# Patient Record
Sex: Female | Born: 1950 | Race: Black or African American | Hispanic: No | Marital: Married | Smoking: Never smoker
Health system: Southern US, Community
[De-identification: ages and names within clinical notes are randomized; demographics above are authoritative.]

## PROBLEM LIST (undated history)

## (undated) DIAGNOSIS — I10 Essential (primary) hypertension: Secondary | ICD-10-CM

## (undated) DIAGNOSIS — E119 Type 2 diabetes mellitus without complications: Secondary | ICD-10-CM

## (undated) DIAGNOSIS — R7303 Prediabetes: Secondary | ICD-10-CM

## (undated) DIAGNOSIS — L309 Dermatitis, unspecified: Secondary | ICD-10-CM

## (undated) DIAGNOSIS — J309 Allergic rhinitis, unspecified: Secondary | ICD-10-CM

## (undated) HISTORY — DX: Essential (primary) hypertension: I10

## (undated) HISTORY — DX: Allergic rhinitis, unspecified: J30.9

## (undated) HISTORY — PX: TUBAL LIGATION: SHX77

## (undated) HISTORY — DX: Dermatitis, unspecified: L30.9

## (undated) HISTORY — DX: Prediabetes: R73.03

## (undated) HISTORY — PX: KNEE SURGERY: SHX244

## (undated) HISTORY — PX: ABDOMINAL HYSTERECTOMY: SHX81

## (undated) HISTORY — PX: COLONOSCOPY: SHX174

---

## 2001-05-20 ENCOUNTER — Ambulatory Visit (HOSPITAL_COMMUNITY): Admission: RE | Admit: 2001-05-20 | Discharge: 2001-05-20 | Payer: Self-pay | Admitting: Obstetrics and Gynecology

## 2001-05-20 ENCOUNTER — Encounter: Payer: Self-pay | Admitting: Obstetrics and Gynecology

## 2002-05-25 ENCOUNTER — Encounter: Payer: Self-pay | Admitting: Family Medicine

## 2002-05-25 ENCOUNTER — Ambulatory Visit (HOSPITAL_COMMUNITY): Admission: RE | Admit: 2002-05-25 | Discharge: 2002-05-25 | Payer: Self-pay | Admitting: Family Medicine

## 2003-07-20 ENCOUNTER — Encounter: Payer: Self-pay | Admitting: Family Medicine

## 2003-07-20 ENCOUNTER — Ambulatory Visit (HOSPITAL_COMMUNITY): Admission: RE | Admit: 2003-07-20 | Discharge: 2003-07-20 | Payer: Self-pay | Admitting: Family Medicine

## 2004-06-11 ENCOUNTER — Ambulatory Visit (HOSPITAL_COMMUNITY): Admission: RE | Admit: 2004-06-11 | Discharge: 2004-06-11 | Payer: Self-pay | Admitting: Family Medicine

## 2004-12-10 ENCOUNTER — Ambulatory Visit (HOSPITAL_COMMUNITY): Admission: RE | Admit: 2004-12-10 | Discharge: 2004-12-10 | Payer: Self-pay | Admitting: Family Medicine

## 2005-02-07 ENCOUNTER — Ambulatory Visit (HOSPITAL_COMMUNITY): Admission: RE | Admit: 2005-02-07 | Discharge: 2005-02-07 | Payer: Self-pay | Admitting: Internal Medicine

## 2005-02-07 ENCOUNTER — Ambulatory Visit: Payer: Self-pay | Admitting: Internal Medicine

## 2006-02-02 ENCOUNTER — Ambulatory Visit (HOSPITAL_COMMUNITY): Admission: RE | Admit: 2006-02-02 | Discharge: 2006-02-02 | Payer: Self-pay | Admitting: Family Medicine

## 2007-02-12 ENCOUNTER — Ambulatory Visit (HOSPITAL_COMMUNITY): Admission: RE | Admit: 2007-02-12 | Discharge: 2007-02-12 | Payer: Self-pay | Admitting: Family Medicine

## 2008-02-29 ENCOUNTER — Ambulatory Visit (HOSPITAL_COMMUNITY): Admission: RE | Admit: 2008-02-29 | Discharge: 2008-02-29 | Payer: Self-pay | Admitting: Family Medicine

## 2009-03-05 ENCOUNTER — Ambulatory Visit (HOSPITAL_COMMUNITY): Admission: RE | Admit: 2009-03-05 | Discharge: 2009-03-05 | Payer: Self-pay | Admitting: Family Medicine

## 2009-11-11 ENCOUNTER — Emergency Department (HOSPITAL_COMMUNITY): Admission: EM | Admit: 2009-11-11 | Discharge: 2009-11-11 | Payer: Self-pay | Admitting: Emergency Medicine

## 2010-02-26 ENCOUNTER — Emergency Department (HOSPITAL_COMMUNITY): Admission: EM | Admit: 2010-02-26 | Discharge: 2010-02-26 | Payer: Self-pay | Admitting: Emergency Medicine

## 2010-03-19 ENCOUNTER — Ambulatory Visit (HOSPITAL_COMMUNITY): Admission: RE | Admit: 2010-03-19 | Discharge: 2010-03-19 | Payer: Self-pay | Admitting: Family Medicine

## 2010-10-10 ENCOUNTER — Ambulatory Visit (HOSPITAL_COMMUNITY)
Admission: RE | Admit: 2010-10-10 | Discharge: 2010-10-10 | Payer: Self-pay | Source: Home / Self Care | Attending: Family Medicine | Admitting: Family Medicine

## 2010-12-29 LAB — URINALYSIS, ROUTINE W REFLEX MICROSCOPIC
Bilirubin Urine: NEGATIVE
Leukocytes, UA: NEGATIVE
Protein, ur: 100 mg/dL — AB
Specific Gravity, Urine: 1.02 (ref 1.005–1.030)
pH: 7.5 (ref 5.0–8.0)

## 2010-12-29 LAB — DIFFERENTIAL
Eosinophils Absolute: 0 10*3/uL (ref 0.0–0.7)
Eosinophils Relative: 1 % (ref 0–5)
Lymphs Abs: 1.4 10*3/uL (ref 0.7–4.0)
Monocytes Absolute: 0.4 10*3/uL (ref 0.1–1.0)
Monocytes Relative: 5 % (ref 3–12)
Neutro Abs: 6.1 10*3/uL (ref 1.7–7.7)
Neutrophils Relative %: 76 % (ref 43–77)

## 2010-12-29 LAB — COMPREHENSIVE METABOLIC PANEL
AST: 29 U/L (ref 0–37)
Albumin: 4.4 g/dL (ref 3.5–5.2)
BUN: 9 mg/dL (ref 6–23)
CO2: 29 mEq/L (ref 19–32)
Calcium: 9.5 mg/dL (ref 8.4–10.5)
Chloride: 98 mEq/L (ref 96–112)
GFR calc non Af Amer: >60 mL/min (ref >60)
Glucose, Bld: 131 mg/dL — ABNORMAL HIGH (ref 70–99)
Sodium: 138 mEq/L (ref 135–145)
Total Bilirubin: 0.6 mg/dL (ref 0.3–1.2)
Total Protein: 7.7 g/dL (ref 6.0–8.3)

## 2010-12-29 LAB — CBC
HCT: 41 % (ref 36.0–46.0)
Platelets: 177 10*3/uL (ref 150–400)
RDW: 13.4 % (ref 11.5–15.5)

## 2010-12-29 LAB — URINE MICROSCOPIC-ADD ON

## 2010-12-29 LAB — URINE CULTURE: Culture: NO GROWTH

## 2010-12-30 LAB — URINE CULTURE
Colony Count: NO GROWTH
Culture: NO GROWTH

## 2010-12-30 LAB — URINALYSIS, ROUTINE W REFLEX MICROSCOPIC
Nitrite: NEGATIVE
Specific Gravity, Urine: 1.015 (ref 1.005–1.030)

## 2010-12-30 LAB — DIFFERENTIAL
Basophils Absolute: 0 10*3/uL (ref 0.0–0.1)
Lymphocytes Relative: 21 % (ref 12–46)
Lymphs Abs: 1.6 10*3/uL (ref 0.7–4.0)
Monocytes Absolute: 0.4 10*3/uL (ref 0.1–1.0)
Neutro Abs: 5.2 10*3/uL (ref 1.7–7.7)
Neutrophils Relative %: 71 % (ref 43–77)

## 2010-12-30 LAB — BASIC METABOLIC PANEL
BUN: 10 mg/dL (ref 6–23)
Creatinine, Ser: 0.94 mg/dL (ref 0.4–1.2)
GFR calc non Af Amer: >60 mL/min (ref >60)
Glucose, Bld: 156 mg/dL — ABNORMAL HIGH (ref 70–99)
Potassium: 2.6 mEq/L — CL (ref 3.5–5.1)

## 2010-12-30 LAB — CBC
MCHC: 35.9 g/dL (ref 30.0–36.0)
MCV: 84.3 fL (ref 78.0–100.0)
RBC: 4.72 MIL/uL (ref 3.87–5.11)
WBC: 7.4 10*3/uL (ref 4.0–10.5)

## 2010-12-30 LAB — URINE MICROSCOPIC-ADD ON

## 2010-12-30 LAB — POCT CARDIAC MARKERS
CKMB, poc: 1.9 ng/mL (ref 1.0–8.0)
Myoglobin, poc: 146 ng/mL (ref 12–200)

## 2010-12-30 LAB — MAGNESIUM: Magnesium: 1.7 mg/dL (ref 1.5–2.5)

## 2011-02-28 NOTE — Op Note (Signed)
NAME:  Tracy Spencer, Tracy Spencer              ACCOUNT NO.:  [id number]   MEDICAL RECORD NO.:  [id number]          PATIENT TYPE:  AMB   LOCATION:  DAY                           FACILITY:  [facility]   PHYSICIAN:  R. Roetta Sessions, M.D. DATE OF BIRTH:  [birth year]   DATE OF PROCEDURE:  02/07/2005  DATE OF DISCHARGE:                                 OPERATIVE REPORT   PROCEDURE:  Screening colonoscopy.   INDICATIONS FOR PROCEDURE:  The patient is a 60 year old African-American  female referred at the courtesy of Dr. Lorin Picket for colorectal cancer  screening. She has never had a colonoscopy. She has no lower GI tract  symptoms. There is no family history of colorectal neoplasia. Colonoscopy is  now being done as a screening maneuver. This approach has been discussed  with the patient. Potential risks, benefits, and alternatives have been  reviewed and questions answered. She is agreeable. Please see documentation  in the medical record.   PROCEDURE NOTE:  O2 saturation, blood pressure, pulse, and respirations were  monitored throughout the entire procedure. Conscious sedation with Versed 3  mg IV and Demerol 75 mg IV in divided doses.   INSTRUMENT:  Olympus video chip system.   FINDINGS:  Digital exam revealed no abnormalities.   ENDOSCOPIC FINDINGS:  Prep was good.   Rectum:  Examination of the rectal mucosa including retroflexed view of the  anal verge revealed no abnormalities.   Colon:  Colonic mucosa was surveyed from the rectosigmoid junction through  the left, transverse, and right colon to the area of the appendiceal  orifice, ileocecal valve, and cecum. These structures were well seen and  photographed for the record. Olympus videoscope was slowly withdrawn. All  previously mentioned mucosal surfaces were again seen. The patient was noted  to have scattered left side diverticula. The remainder of the colonic mucosa  appeared normal. The patient tolerated the procedure well and was reactive  to endoscopy.   IMPRESSION:  1.  Normal rectum.  2.  Left sided diverticula. The remainder of colonic mucosa appeared normal.   RECOMMENDATIONS:  1.  Diverticulosis literature given to [name].  2.  Would recommend a repeat screening colonoscopy in 10 years.      RMR/MEDQ  D:  02/07/2005  T:  02/07/2005  Job:  [id number]   cc:   Lorin Picket A. Gerda Diss, MD  [address]  [place]  [place] [address]  Fax: [phone]

## 2011-03-18 ENCOUNTER — Other Ambulatory Visit: Payer: Self-pay | Admitting: Family Medicine

## 2011-03-18 DIAGNOSIS — Z139 Encounter for screening, unspecified: Secondary | ICD-10-CM

## 2011-03-27 ENCOUNTER — Ambulatory Visit (HOSPITAL_COMMUNITY): Payer: Self-pay

## 2011-03-27 ENCOUNTER — Ambulatory Visit (HOSPITAL_COMMUNITY)
Admission: RE | Admit: 2011-03-27 | Discharge: 2011-03-27 | Disposition: A | Discharge status: Home / Self Care | Payer: 59 | Source: Ambulatory Visit | Attending: Family Medicine | Admitting: Family Medicine

## 2011-03-27 DIAGNOSIS — Z139 Encounter for screening, unspecified: Secondary | ICD-10-CM

## 2011-03-27 DIAGNOSIS — Z1231 Encounter for screening mammogram for malignant neoplasm of breast: Secondary | ICD-10-CM | POA: Insufficient documentation

## 2011-08-20 ENCOUNTER — Other Ambulatory Visit: Payer: Self-pay | Admitting: Family Medicine

## 2011-08-20 ENCOUNTER — Ambulatory Visit (HOSPITAL_COMMUNITY)
Admission: RE | Admit: 2011-08-20 | Discharge: 2011-08-20 | Disposition: A | Discharge status: Home / Self Care | Payer: BC Managed Care – PPO | Source: Ambulatory Visit | Attending: Family Medicine | Admitting: Family Medicine

## 2011-08-20 DIAGNOSIS — M545 Low back pain, unspecified: Secondary | ICD-10-CM | POA: Insufficient documentation

## 2011-08-20 DIAGNOSIS — M47817 Spondylosis without myelopathy or radiculopathy, lumbosacral region: Secondary | ICD-10-CM | POA: Insufficient documentation

## 2011-08-20 DIAGNOSIS — M5430 Sciatica, unspecified side: Secondary | ICD-10-CM

## 2011-08-20 DIAGNOSIS — M79609 Pain in unspecified limb: Secondary | ICD-10-CM | POA: Insufficient documentation

## 2012-02-19 ENCOUNTER — Other Ambulatory Visit: Payer: Self-pay | Admitting: Family Medicine

## 2012-02-19 DIAGNOSIS — Z139 Encounter for screening, unspecified: Secondary | ICD-10-CM

## 2012-03-29 ENCOUNTER — Ambulatory Visit (HOSPITAL_COMMUNITY)
Admission: RE | Admit: 2012-03-29 | Discharge: 2012-03-29 | Disposition: A | Discharge status: Home / Self Care | Payer: BC Managed Care – PPO | Source: Ambulatory Visit | Attending: Family Medicine | Admitting: Family Medicine

## 2012-03-29 DIAGNOSIS — Z1231 Encounter for screening mammogram for malignant neoplasm of breast: Secondary | ICD-10-CM | POA: Insufficient documentation

## 2012-03-29 DIAGNOSIS — Z139 Encounter for screening, unspecified: Secondary | ICD-10-CM

## 2013-01-11 ENCOUNTER — Encounter (HOSPITAL_COMMUNITY): Payer: Self-pay | Admitting: Dietician

## 2013-01-11 NOTE — Progress Notes (Signed)
[facility] Diabetes Class Completion  Date:January 11, 2013  Time: 1000  Pt attended [facility]'s [facility] Education Class on January 11, 2013.   Patient was educated on the following topics: survival skills (signs and symptoms of hyperglycemia and hypoglycemia, treatment for hypoglycemia, ideal levels for fasting and postprandial blood sugars, goal Hgb A1c level, foot care basics), recommendations for physical activity, carbohydrate metabolism in relation to diabetes, and meal planning (sources of carbohydrate, carbohydrate counting, meal planning strategies, food label reading, and portion control).   Tracy Spencer, RD, LDN   

## 2013-01-28 ENCOUNTER — Encounter: Payer: Self-pay | Admitting: *Deleted

## 2013-02-01 ENCOUNTER — Encounter: Payer: Self-pay | Admitting: Family Medicine

## 2013-02-01 ENCOUNTER — Ambulatory Visit (INDEPENDENT_AMBULATORY_CARE_PROVIDER_SITE_OTHER): Payer: BC Managed Care – PPO | Admitting: Family Medicine

## 2013-02-01 VITALS — BP 168/110 | HR 80 | Wt 197.0 lb

## 2013-02-01 DIAGNOSIS — E785 Hyperlipidemia, unspecified: Secondary | ICD-10-CM

## 2013-02-01 DIAGNOSIS — I10 Essential (primary) hypertension: Secondary | ICD-10-CM

## 2013-02-01 DIAGNOSIS — R739 Hyperglycemia, unspecified: Secondary | ICD-10-CM | POA: Insufficient documentation

## 2013-02-01 DIAGNOSIS — R7309 Other abnormal glucose: Secondary | ICD-10-CM

## 2013-02-01 MED ORDER — LOSARTAN POTASSIUM-HCTZ 100-25 MG PO TABS: 1.0000 | ORAL_TABLET | Freq: Every day | ORAL | Status: DC

## 2013-02-01 NOTE — Patient Instructions (Signed)
Our goal is to see her blood pressure closer to the low 130s over low 80s. I want you to check your blood pressure once or twice a week for the next several weeks and then please write those down and send them to our office or bring them by. At the end of day or start of June I want you to do her fasting blood work. I would like to see you in June.  Please get into the habit of walking on a regular basis for exercise as well as minimizing starchy foods in your diet.  Losartan/ hctz daily for blood pressure

## 2013-02-01 NOTE — Progress Notes (Signed)
  Subjective:    Patient ID: Tracy Spencer, female    DOB: Mar 22, 1951, 62 y.o.   MRN: [id number]  Hypertension This is a chronic problem. The current episode started more than 1 year ago. The problem has been gradually worsening since onset. The problem is uncontrolled. Pertinent negatives include no chest pain, headaches, malaise/fatigue, orthopnea, palpitations, peripheral edema or PND. There are no associated agents to hypertension. Risk factors for coronary artery disease include diabetes mellitus and dyslipidemia. Past treatments include angiotensin blockers. The current treatment provides mild improvement. There are no compliance problems.  There is no history of angina or CVA.      Review of Systems  Constitutional: Negative for malaise/fatigue, activity change, appetite change and fatigue.  HENT: Negative for congestion and rhinorrhea.   Respiratory: Negative for cough and chest tightness.   Cardiovascular: Negative for chest pain, palpitations, orthopnea and PND.  Neurological: Negative for headaches.       Objective:   Physical Exam  Vitals reviewed. Constitutional: She appears well-developed.  HENT:  Head: Normocephalic.  Neck: Normal range of motion. No thyromegaly present.  Cardiovascular: Normal rate, regular rhythm and normal heart sounds.   Pulmonary/Chest: Effort normal and breath sounds normal.  Abdominal: Soft.  Lymphadenopathy:    She has no cervical adenopathy.    Blood pressure was checked twice with her machine and twice with the wall unit  190/108      Assessment & Plan:  Hypertension-subpar control, changed to losartan 100 mg/HCTZ 25 mg one tablet each morning check blood pressure 2-3 times over the next several weeks send [place] those readings. Also followup early June. To do lab work before followup. Regular walking watch salt in diet.

## 2013-02-14 ENCOUNTER — Telehealth: Payer: Self-pay | Admitting: Family Medicine

## 2013-02-14 NOTE — Telephone Encounter (Signed)
Pt would like to know if you can call in something for poison ivy that has now reached her face.  I have advised pt that she may need an appt for this, our schedule today did not work for her schedule.  [facility]

## 2013-02-14 NOTE — Telephone Encounter (Signed)
Discuss the case with the patient please typically if it's just small amounts of various steroid creams can help but when it more extensive or around the face we typically use prednisone. Very important to minimize starches in the diet while on prednisone. I would recommend prednisone 20 mg 3 tablets daily for 3 days then 2 tablets daily for 3 days then 1 tablet daily for 3 days.

## 2013-02-15 MED ORDER — PREDNISONE 20 MG PO TABS: ORAL_TABLET | ORAL | Status: DC

## 2013-02-15 NOTE — Telephone Encounter (Signed)
Discussed with patient. Med sent electronically to [facility].

## 2013-02-28 ENCOUNTER — Ambulatory Visit (INDEPENDENT_AMBULATORY_CARE_PROVIDER_SITE_OTHER): Payer: BC Managed Care – PPO | Admitting: Family Medicine

## 2013-02-28 ENCOUNTER — Encounter: Payer: Self-pay | Admitting: Family Medicine

## 2013-02-28 VITALS — BP 130/82 | Temp 98.5°F | Wt 194.0 lb

## 2013-02-28 DIAGNOSIS — L259 Unspecified contact dermatitis, unspecified cause: Secondary | ICD-10-CM

## 2013-02-28 NOTE — Patient Instructions (Signed)
We will set up appt with Dr.Hall

## 2013-02-28 NOTE — Progress Notes (Signed)
  Subjective:    Patient ID: Tracy Spencer, female    DOB: 1951/06/15, 62 y.o.   MRN: [id number]  Rash This is a new problem. The affected locations include the face, left upper leg, left lower leg, left arm, right upper leg, right lower leg, right hand and abdomen. The rash is characterized by itchiness and swelling. Past treatments include moisturizer. The treatment provided no relief.      Review of Systems  Skin: Positive for rash.       Objective:   Physical Exam  Has a non-distinct rash on the arms neck face also she states same rash on lower abdomen and right leg. She denies being around any type outdoor allergens to her knowledge no new medications.      Assessment & Plan:  Rash- derm referral, I am uncertain what is causing this. She is hard he tried a steroid cream without help. I think it's best for her to go ahead and be seen by dermatology. We will help set her up with an appointment.

## 2013-03-08 ENCOUNTER — Other Ambulatory Visit: Payer: Self-pay | Admitting: Family Medicine

## 2013-03-08 DIAGNOSIS — Z139 Encounter for screening, unspecified: Secondary | ICD-10-CM

## 2013-03-09 ENCOUNTER — Other Ambulatory Visit: Payer: Self-pay | Admitting: *Deleted

## 2013-03-09 ENCOUNTER — Telehealth: Payer: Self-pay | Admitting: *Deleted

## 2013-03-09 LAB — HEPATIC FUNCTION PANEL
ALT: 28 U/L (ref 0–35)
AST: 24 U/L (ref 0–37)
Alkaline Phosphatase: 69 U/L (ref 39–117)
Bilirubin, Direct: 0.1 mg/dL (ref 0.0–0.3)
Total Bilirubin: 0.6 mg/dL (ref 0.3–1.2)

## 2013-03-09 LAB — BASIC METABOLIC PANEL
BUN: 10 mg/dL (ref 6–23)
Creat: 1.01 mg/dL (ref 0.50–1.10)
Potassium: 3.2 mEq/L — ABNORMAL LOW (ref 3.5–5.3)

## 2013-03-09 LAB — LIPID PANEL
Cholesterol: 195 mg/dL (ref 0–200)
HDL: 63 mg/dL (ref >39)
Triglycerides: 97 mg/dL (ref <150)

## 2013-03-09 MED ORDER — AMLODIPINE BESYLATE 2.5 MG PO TABS: 2.5000 mg | ORAL_TABLET | Freq: Every day | ORAL | Status: DC

## 2013-03-09 NOTE — Telephone Encounter (Signed)
Message left to pt of medication called into pharmacy Norvasc 2.5mg , and to f/u in June

## 2013-03-10 ENCOUNTER — Telehealth: Payer: Self-pay

## 2013-03-10 NOTE — Telephone Encounter (Signed)
Pt thought it was time for her next colonoscopy. Her last one was 01/2005 by RMR. And he recommended her next for 10 years. She is not having any GI problems and no new family hx of colon cancer. We have her nic'd in the computer to remind for colonoscopy in 01/2015. She will call before then if she has any GI problems.

## 2013-03-21 ENCOUNTER — Ambulatory Visit (INDEPENDENT_AMBULATORY_CARE_PROVIDER_SITE_OTHER): Payer: BC Managed Care – PPO | Admitting: Family Medicine

## 2013-03-21 ENCOUNTER — Encounter: Payer: Self-pay | Admitting: Family Medicine

## 2013-03-21 VITALS — BP 139/88 | HR 80 | Wt 197.0 lb

## 2013-03-21 DIAGNOSIS — E785 Hyperlipidemia, unspecified: Secondary | ICD-10-CM | POA: Insufficient documentation

## 2013-03-21 DIAGNOSIS — R739 Hyperglycemia, unspecified: Secondary | ICD-10-CM

## 2013-03-21 DIAGNOSIS — I10 Essential (primary) hypertension: Secondary | ICD-10-CM

## 2013-03-21 DIAGNOSIS — R7309 Other abnormal glucose: Secondary | ICD-10-CM

## 2013-03-21 MED ORDER — AMLODIPINE BESYLATE 5 MG PO TABS: ORAL_TABLET | ORAL | Status: DC

## 2013-03-21 MED ORDER — ROSUVASTATIN CALCIUM 40 MG PO TABS: 40.0000 mg | ORAL_TABLET | Freq: Every day | ORAL | Status: DC

## 2013-03-21 NOTE — Progress Notes (Signed)
  Subjective:    Patient ID: Tracy Spencer, female    DOB: 22-Aug-1951, 62 y.o.   MRN: [id number]  HPI Patient comes in today she states she is watching her diet to some degree she is taking her medicines she states she could do better on minimizing starches. She denies any chest pressure shortness breath nausea vomiting diarrhea she denies high fevers or chills. She does relate that time she is concerned about her blood pressure is higher than what she thinks it ought to be. Her medicine list was reviewed past medical history reviewed social and family history as well.   Review of Systems See above.    Objective:   Physical Exam Blood pressure was rechecked and was borderline lungs clear heart is regular neck no masses eardrums normal throat is normal       Assessment & Plan:  Hyperglycemia  Essential hypertension, benign  Hyperlipemia  She does not want to start on any medications currently she is to watch her diet closely she will followup in 3 months we'll check hemoglobin A1c at that visit. No other lab work indicated currently. She is encouraged to take her medicines on a regular basis.Her amlodipine was increased to 5 mg daily.

## 2013-03-21 NOTE — Patient Instructions (Addendum)
DASH Diet The DASH diet stands for "Dietary Approaches to Stop Hypertension." It is a healthy eating plan that has been shown to reduce high blood pressure (hypertension) in as little as 14 days, while also possibly providing other significant health benefits. These other health benefits include reducing the risk of breast cancer after menopause and reducing the risk of type 2 diabetes, heart disease, colon cancer, and stroke. Health benefits also include weight loss and slowing kidney failure in patients with chronic kidney disease.  DIET GUIDELINES  Limit salt (sodium). Your diet should contain less than 1500 mg of sodium daily.  Limit refined or processed carbohydrates. Your diet should include mostly whole grains. Desserts and added sugars should be used sparingly.  Include small amounts of heart-healthy fats. These types of fats include nuts, oils, and tub margarine. Limit saturated and trans fats. These fats have been shown to be harmful in the body. CHOOSING FOODS  The following food groups are based on a 2000 calorie diet. See your Registered Dietitian for individual calorie needs. Grains and Grain Products (6 to 8 servings daily)  Eat More Often: Whole-wheat bread, brown rice, whole-grain or wheat pasta, quinoa, popcorn without added fat or salt (air popped).  Eat Less Often: White bread, white pasta, white rice, cornbread. Vegetables (4 to 5 servings daily)  Eat More Often: Fresh, frozen, and canned vegetables. Vegetables may be raw, steamed, roasted, or grilled with a minimal amount of fat.  Eat Less Often/Avoid: Creamed or fried vegetables. Vegetables in a cheese sauce. Fruit (4 to 5 servings daily)  Eat More Often: All fresh, canned (in natural juice), or frozen fruits. Dried fruits without added sugar. One hundred percent fruit juice ( cup [237 mL] daily).  Eat Less Often: Dried fruits with added sugar. Canned fruit in light or heavy syrup. [employer], Fish, and Poultry (2  servings or less daily. One serving is 3 to 4 oz [85-114 g]).  Eat More Often: Ninety percent or leaner ground beef, tenderloin, sirloin. Round cuts of beef, chicken breast, [place] breast. All fish. Grill, bake, or broil your meat. Nothing should be fried.  Eat Less Often/Avoid: Fatty cuts of meat, [place], or chicken leg, thigh, or wing. Fried cuts of meat or fish. Dairy (2 to 3 servings)  Eat More Often: Low-fat or fat-free milk, low-fat plain or light yogurt, reduced-fat or part-skim cheese.  Eat Less Often/Avoid: Milk (whole, 2%).Whole milk yogurt. Full-fat cheeses. Nuts, Seeds, and Legumes (4 to 5 servings per week)  Eat More Often: All without added salt.  Eat Less Often/Avoid: Salted nuts and seeds, canned beans with added salt. Fats and Sweets (limited)  Eat More Often: Vegetable oils, tub margarines without trans fats, sugar-free gelatin. Mayonnaise and salad dressings.  Eat Less Often/Avoid: Coconut oils, palm oils, butter, stick margarine, cream, half and half, cookies, candy, pie. FOR MORE INFORMATION The Dash Diet Eating Plan: www.dashdiet.org Document Released: 09/18/2011 Document Revised: 12/22/2011 Document Reviewed: 09/18/2011 [facility] Patient Information 2014 [place], [place]. Diets for Diabetes, Food Labeling Look at food labels to help you decide how much of a product you can eat. You will want to check the amount of total carbohydrate in a serving to see how the food fits into your meal plan. In the list of ingredients, the ingredient present in the largest amount by weight must be listed first, followed by the other ingredients in descending order. STANDARD OF IDENTITY Most products have a list of ingredients. However, foods that the Food and Drug Administration (  FDA) has given a standard of identity do not need a list of ingredients. A standard of identity means that a food must contain certain ingredients if it is called a particular name. Examples are mayonnaise,  peanut butter, ketchup, jelly, and cheese. LABELING TERMS There are many terms found on food labels. Some of these terms have specific definitions. Some terms are regulated by the FDA, and the FDA has clearly specified how they can be used. Others are not regulated or well-defined and can be misleading and confusing. SPECIFICALLY DEFINED TERMS Nutritive Sweetener.  A sweetener that contains calories,such as table sugar or honey. Nonnutritive Sweetener.  A sweetener with few or no calories,such as saccharin, aspartame, sucralose, and cyclamate. LABELING TERMS REGULATED BY THE FDA Free.  The product contains only a tiny or small amount of fat, cholesterol, sodium, sugar, or calories. For example, a "fat-free" product will contain less than 0.5 g of fat per serving. Low.  A food described as "low" in fat, saturated fat, cholesterol, sodium, or calories could be eaten fairly often without exceeding dietary guidelines. For example, "low in fat" means no more than 3 g of fat per serving. Lean.  "Lean" and "extra lean" are U.S. Department of Agriculture Architect) terms for use on meat and poultry products. "Lean" means the product contains less than 10 g of fat, 4 g of saturated fat, and 95 mg of cholesterol per serving. "Lean" is not as low in fat as a product labeled "low." Extra Lean.  "Extra lean" means the product contains less than 5 g of fat, 2 g of saturated fat, and 95 mg of cholesterol per serving. While "extra lean" has less fat than "lean," it is still higher in fat than a product labeled "low." Reduced, Less, Fewer.  A diet product that contains 25% less of a nutrient or calories than the regular version. For example, hot dogs might be labeled "25% less fat than our regular hot dogs." Light/Lite.  A diet product that contains  fewer calories or  the fat of the original. For example, "light in sodium" means a product with  the usual sodium. More.  One serving contains at least 10%  more of the daily value of a vitamin, mineral, or fiber than usual. Good Source Of.  One serving contains 10% to 19% of the daily value for a particular vitamin, mineral, or fiber. Excellent Source Of.  One serving contains 20% or more of the daily value for a particular nutrient. Other terms used might be "high in" or "rich in." Enriched or Fortified.  The product contains added vitamins, minerals, or protein. Nutrition labeling must be used on enriched or fortified foods. Imitation.  The product has been altered so that it is lower in protein, vitamins, or minerals than the usual food,such as imitation peanut butter. Total Fat.  The number listed is the total of all fat found in a serving of the product. Under total fat, food labels must list saturated fat and trans fat, which are associated with raising bad cholesterol and an increased risk of heart blood vessel disease. Saturated Fat.  Mainly fats from animal-based sources. Some examples are red meat, cheese, cream, whole milk, and coconut oil. Trans Fat.  Found in some fried snack foods, packaged foods, and fried restaurant foods. It is recommended you eat as close to 0 g of trans fat as possible, since it raises bad cholesterol and lowers good cholesterol. Polyunsaturated and Monounsaturated Fats.  More healthful fats. These fats  are from plant sources. Total Carbohydrate.  The number of carbohydrate grams in a serving of the product. Under total carbohydrate are listed the other carbohydrate sources, such as dietary fiber and sugars. Dietary Fiber.  A carbohydrate from plant sources. Sugars.  Sugars listed on the label contain all naturally occurring sugars as well as added sugars. LABELING TERMS NOT REGULATED BY THE FDA Sugarless.  Table sugar (sucrose) has not been added. However, the manufacturer may use another form of sugar in place of sucrose to sweeten the product. For example, sugar alcohols are used to sweeten  foods. Sugar alcohols are a form of sugar but are not table sugar. If a product contains sugar alcohols in place of sucrose, it can still be labeled "sugarless." Low Salt, Salt-Free, Unsalted, No Salt, No Salt Added, Without Added Salt.  Food that is usually processed with salt has been made without salt. However, the food may contain sodium-containing additives, such as preservatives, leavening agents, or flavorings. Natural.  This term has no legal meaning. Organic.  Foods that are certified as organic have been inspected and approved by the USDA to ensure they are produced without pesticides, fertilizers containing synthetic ingredients, bioengineering, or ionizing radiation. Document Released: 10/02/2003 Document Revised: 12/22/2011 Document Reviewed: 04/19/2009 [employer] Patient Information 2014 [place], [place]. Diabetes Meal Planning Guide The diabetes meal planning guide is a tool to help you plan your meals and snacks. It is important for people with diabetes to manage their blood glucose (sugar) levels. Choosing the right foods and the right amounts throughout your day will help control your blood glucose. Eating right can even help you improve your blood pressure and reach or maintain a healthy weight. CARBOHYDRATE COUNTING MADE EASY When you eat carbohydrates, they turn to sugar. This raises your blood glucose level. Counting carbohydrates can help you control this level so you feel better. When you plan your meals by counting carbohydrates, you can have more flexibility in what you eat and balance your medicine with your food intake. Carbohydrate counting simply means adding up the total amount of carbohydrate grams in your meals and snacks. Try to eat about the same amount at each meal. Foods with carbohydrates are listed below. Each portion below is 1 carbohydrate serving or 15 grams of carbohydrates. Ask your dietician how many grams of carbohydrates you should eat at each meal or  snack. Grains and Starches  1 slice bread.   English muffin or hotdog/hamburger bun.   cup cold cereal (unsweetened).   cup cooked pasta or rice.   cup starchy vegetables (corn, potatoes, peas, beans, winter squash).  1 tortilla (6 inches).   bagel.  1 waffle or pancake (size of a CD).   cup cooked cereal.  4 to 6 small crackers. *Whole grain is recommended. Fruit  1 cup fresh unsweetened berries, melon, papaya, pineapple.  1 small fresh fruit.   banana or mango.   cup fruit juice (4 oz unsweetened).   cup canned fruit in natural juice or water.  2 tbs dried fruit.  12 to 15 grapes or cherries. Milk and Yogurt  1 cup fat-free or 1% milk.  1 cup soy milk.  6 oz light yogurt with sugar-free sweetener.  6 oz low-fat soy yogurt.  6 oz plain yogurt. Vegetables  1 cup raw or  cup cooked is counted as 0 carbohydrates or a "free" food.  If you eat 3 or more servings at 1 meal, count them as 1 carbohydrate serving. Other Carbohydrates  oz chips or pretzels.   cup ice cream or frozen yogurt.   cup sherbet or sorbet.  2 inch square cake, no frosting.  1 tbs honey, sugar, jam, jelly, or syrup.  2 small cookies.  3 squares of graham crackers.  3 cups popcorn.  6 crackers.  1 cup broth-based soup.  Count 1 cup casserole or other mixed foods as 2 carbohydrate servings.  Foods with less than 20 calories in a serving may be counted as 0 carbohydrates or a "free" food. You may want to purchase a book or computer software that lists the carbohydrate gram counts of different foods. In addition, the nutrition facts panel on the labels of the foods you eat are a good source of this information. The label will tell you how big the serving size is and the total number of carbohydrate grams you will be eating per serving. Divide this number by 15 to obtain the number of carbohydrate servings in a portion. Remember, 1 carbohydrate serving equals 15  grams of carbohydrate. SERVING SIZES Measuring foods and serving sizes helps you make sure you are getting the right amount of food. The list below tells how big or small some common serving sizes are.  1 oz.........4 stacked dice.  3 oz........Marland KitchenDeck of cards.  1 tsp.......Marland KitchenTip of little finger.  1 tbs......Marland KitchenMarland KitchenThumb.  2 tbs.......Marland KitchenGolf ball.   cup......Marland KitchenHalf of a fist.  1 cup.......Marland KitchenA fist. SAMPLE DIABETES MEAL PLAN Below is a sample meal plan that includes foods from the grain and starches, dairy, vegetable, fruit, and meat groups. A dietician can individualize a meal plan to fit your calorie needs and tell you the number of servings needed from each food group. However, controlling the total amount of carbohydrates in your meal or snack is more important than making sure you include all of the food groups at every meal. You may interchange carbohydrate containing foods (dairy, starches, and fruits). The meal plan below is an example of a 2000 calorie diet using carbohydrate counting. This meal plan has 17 carbohydrate servings. Breakfast  1 cup oatmeal (2 carb servings).   cup light yogurt (1 carb serving).  1 cup blueberries (1 carb serving).   cup almonds. Snack  1 large apple (2 carb servings).  1 low-fat string cheese stick. Lunch  Chicken breast salad.  1 cup spinach.   cup chopped tomatoes.  2 oz chicken breast, sliced.  2 tbs low-fat [place] dressing.  12 whole-wheat crackers (2 carb servings).  12 to 15 grapes (1 carb serving).  1 cup low-fat milk (1 carb serving). Snack  1 cup carrots.   cup hummus (1 carb serving). Dinner  3 oz broiled salmon.  1 cup brown rice (3 carb servings). Snack  1  cups steamed broccoli (1 carb serving) drizzled with 1 tsp olive oil and lemon juice.  1 cup light pudding (2 carb servings). DIABETES MEAL PLANNING WORKSHEET Your dietician can use this worksheet to help you decide how many servings of foods and  what types of foods are right for you.  BREAKFAST Food Group and Servings / Carb Servings Grain/Starches __________________________________ Dairy __________________________________________ Vegetable ______________________________________ Fruit ___________________________________________ Meat __________________________________________ Fat ____________________________________________ LUNCH Food Group and Servings / Carb Servings Grain/Starches ___________________________________ Dairy ___________________________________________ Fruit ____________________________________________ Meat ___________________________________________ Fat _____________________________________________ Laural Golden Food Group and Servings / Carb Servings Grain/Starches ___________________________________ Dairy ___________________________________________ Fruit ____________________________________________ Meat ___________________________________________ Fat _____________________________________________ SNACKS Food Group and Servings / Carb Servings Grain/Starches ___________________________________ Dairy ___________________________________________ Vegetable _______________________________________ Fruit ____________________________________________ Meat ___________________________________________ Fat _____________________________________________  DAILY TOTALS Starches _________________________ Vegetable ________________________ Fruit ____________________________ Dairy ____________________________ Meat ____________________________ Fat ______________________________ Document Released: 06/26/2005 Document Revised: 12/22/2011 Document Reviewed: 05/07/2009 [employer] Patient Information 2014 [place], [place].

## 2013-04-01 ENCOUNTER — Ambulatory Visit (HOSPITAL_COMMUNITY): Payer: BC Managed Care – PPO

## 2013-04-11 ENCOUNTER — Ambulatory Visit (HOSPITAL_COMMUNITY)
Admission: RE | Admit: 2013-04-11 | Discharge: 2013-04-11 | Disposition: A | Discharge status: Home / Self Care | Payer: BC Managed Care – PPO | Source: Ambulatory Visit | Attending: Family Medicine | Admitting: Family Medicine

## 2013-04-11 DIAGNOSIS — Z139 Encounter for screening, unspecified: Secondary | ICD-10-CM

## 2013-04-11 DIAGNOSIS — Z1231 Encounter for screening mammogram for malignant neoplasm of breast: Secondary | ICD-10-CM | POA: Insufficient documentation

## 2013-05-18 ENCOUNTER — Telehealth: Payer: Self-pay | Admitting: Family Medicine

## 2013-05-18 NOTE — Telephone Encounter (Signed)
Blood pressure readings are coming down. She doesn't check every day. 139/88, 138/96, 115/69 ,112/76, 101/69 ,95/67, 101/69. She is exercising and taking hyzar 100-25mg  one a day. Amlodipine was increased in June from 2.5 to 5mg , since then she is feeling weak in her legs. Wants to know if she should go back to 2.5mg .

## 2013-05-18 NOTE — Telephone Encounter (Signed)
Discussed with patient. Patient verbalized understanding. 

## 2013-05-18 NOTE — Telephone Encounter (Signed)
Left message to return call 

## 2013-05-18 NOTE — Telephone Encounter (Signed)
Blood Pressure making patient feel weak.  Feels like BP is getting too Low.  Can she take 1/2 tablet?  Please call Patient. Thanks

## 2013-05-18 NOTE — Telephone Encounter (Signed)
She should go back to 2.5 mg daily. Keep regular followup appointment.

## 2013-06-22 ENCOUNTER — Encounter: Payer: Self-pay | Admitting: Family Medicine

## 2013-06-22 ENCOUNTER — Ambulatory Visit (INDEPENDENT_AMBULATORY_CARE_PROVIDER_SITE_OTHER): Payer: BC Managed Care – PPO | Admitting: Family Medicine

## 2013-06-22 VITALS — BP 122/68 | Ht 65.0 in | Wt 177.0 lb

## 2013-06-22 DIAGNOSIS — E119 Type 2 diabetes mellitus without complications: Secondary | ICD-10-CM | POA: Insufficient documentation

## 2013-06-22 DIAGNOSIS — Z79899 Other long term (current) drug therapy: Secondary | ICD-10-CM

## 2013-06-22 DIAGNOSIS — R7309 Other abnormal glucose: Secondary | ICD-10-CM

## 2013-06-22 DIAGNOSIS — R739 Hyperglycemia, unspecified: Secondary | ICD-10-CM

## 2013-06-22 DIAGNOSIS — E876 Hypokalemia: Secondary | ICD-10-CM

## 2013-06-22 DIAGNOSIS — E785 Hyperlipidemia, unspecified: Secondary | ICD-10-CM

## 2013-06-22 LAB — POCT GLYCOSYLATED HEMOGLOBIN (HGB A1C): Hemoglobin A1C: >13

## 2013-06-22 MED ORDER — GLIPIZIDE 5 MG PO TABS: 5.0000 mg | ORAL_TABLET | Freq: Two times a day (BID) | ORAL | Status: DC

## 2013-06-22 MED ORDER — METFORMIN HCL 500 MG PO TABS: 500.0000 mg | ORAL_TABLET | Freq: Two times a day (BID) | ORAL | Status: DC

## 2013-06-22 NOTE — Progress Notes (Signed)
  Subjective:    Patient ID: Tracy Spencer, female    DOB: October 13, 1951, 62 y.o.   MRN: [id number]  HPI Patient arrives for a follow up on blood pressure and diabetes. Patient states she is experiencing extreme fatigue and wonders if it is from her meds. Patient diagnosed with diabetes a few months ago she decided to attack it with diet alone she's been losing weight and excessive thirst and urination. She relates fatigue tiredness no fever. No vomiting no bloody stools. PMH diabetes family history noncontributory  Review of Systems See above    Objective:   Physical Exam Lungs are clear hearts regular pulse normal foot exam normal extremities no edema skin warm dry       Assessment & Plan:  Diabetes poor control lab work ordered hemoglobin A1c greater than 13 patient does not want to go on insulin. Start metformin 500 mg twice a day glipizide 5 mg twice a day followup within a few weeks. Sinus readings on a regular basis. Check sugars several times per week morning and evening. If significantly worse call to be seen soon. May need endocrinology consult patient does not want this currently

## 2013-06-22 NOTE — Patient Instructions (Addendum)
Www.diabetes.org  - has information about diabetes  I recommend setting you up with an endocrinologist if not doing better  Start Glucophage as 1/2 twice a day for 1 week then 1 twice a day Start Glipizide 5mg  one twice a day  Send me your readings every 2 weeks Recheck here in 4 week Diabetes Meal Planning Guide The diabetes meal planning guide is a tool to help you plan your meals and snacks. It is important for people with diabetes to manage their blood glucose (sugar) levels. Choosing the right foods and the right amounts throughout your day will help control your blood glucose. Eating right can even help you improve your blood pressure and reach or maintain a healthy weight. CARBOHYDRATE COUNTING MADE EASY When you eat carbohydrates, they turn to sugar. This raises your blood glucose level. Counting carbohydrates can help you control this level so you feel better. When you plan your meals by counting carbohydrates, you can have more flexibility in what you eat and balance your medicine with your food intake. Carbohydrate counting simply means adding up the total amount of carbohydrate grams in your meals and snacks. Try to eat about the same amount at each meal. Foods with carbohydrates are listed below. Each portion below is 1 carbohydrate serving or 15 grams of carbohydrates. Ask your dietician how many grams of carbohydrates you should eat at each meal or snack. Grains and Starches  1 slice bread.   English muffin or hotdog/hamburger bun.   cup cold cereal (unsweetened).   cup cooked pasta or rice.   cup starchy vegetables (corn, potatoes, peas, beans, winter squash).  1 tortilla (6 inches).   bagel.  1 waffle or pancake (size of a CD).   cup cooked cereal.  4 to 6 small crackers. *Whole grain is recommended. Fruit  1 cup fresh unsweetened berries, melon, papaya, pineapple.  1 small fresh fruit.   banana or mango.   cup fruit juice (4 oz unsweetened).    cup canned fruit in natural juice or water.  2 tbs dried fruit.  12 to 15 grapes or cherries. Milk and Yogurt  1 cup fat-free or 1% milk.  1 cup soy milk.  6 oz light yogurt with sugar-free sweetener.  6 oz low-fat soy yogurt.  6 oz plain yogurt. Vegetables  1 cup raw or  cup cooked is counted as 0 carbohydrates or a "free" food.  If you eat 3 or more servings at 1 meal, count them as 1 carbohydrate serving. Other Carbohydrates   oz chips or pretzels.   cup ice cream or frozen yogurt.   cup sherbet or sorbet.  2 inch square cake, no frosting.  1 tbs honey, sugar, jam, jelly, or syrup.  2 small cookies.  3 squares of graham crackers.  3 cups popcorn.  6 crackers.  1 cup broth-based soup.  Count 1 cup casserole or other mixed foods as 2 carbohydrate servings.  Foods with less than 20 calories in a serving may be counted as 0 carbohydrates or a "free" food. You may want to purchase a book or computer software that lists the carbohydrate gram counts of different foods. In addition, the nutrition facts panel on the labels of the foods you eat are a good source of this information. The label will tell you how big the serving size is and the total number of carbohydrate grams you will be eating per serving. Divide this number by 15 to obtain the number of carbohydrate servings in  a portion. Remember, 1 carbohydrate serving equals 15 grams of carbohydrate. SERVING SIZES Measuring foods and serving sizes helps you make sure you are getting the right amount of food. The list below tells how big or small some common serving sizes are.  1 oz.........4 stacked dice.  3 oz........Marland KitchenDeck of cards.  1 tsp.......Marland KitchenTip of little finger.  1 tbs......Marland KitchenMarland KitchenThumb.  2 tbs.......Marland KitchenGolf ball.   cup......Marland KitchenHalf of a fist.  1 cup.......Marland KitchenA fist. SAMPLE DIABETES MEAL PLAN Below is a sample meal plan that includes foods from the grain and starches, dairy, vegetable, fruit, and meat  groups. A dietician can individualize a meal plan to fit your calorie needs and tell you the number of servings needed from each food group. However, controlling the total amount of carbohydrates in your meal or snack is more important than making sure you include all of the food groups at every meal. You may interchange carbohydrate containing foods (dairy, starches, and fruits). The meal plan below is an example of a 2000 calorie diet using carbohydrate counting. This meal plan has 17 carbohydrate servings. Breakfast  1 cup oatmeal (2 carb servings).   cup light yogurt (1 carb serving).  1 cup blueberries (1 carb serving).   cup almonds. Snack  1 large apple (2 carb servings).  1 low-fat string cheese stick. Lunch  Chicken breast salad.  1 cup spinach.   cup chopped tomatoes.  2 oz chicken breast, sliced.  2 tbs low-fat [place] dressing.  12 whole-wheat crackers (2 carb servings).  12 to 15 grapes (1 carb serving).  1 cup low-fat milk (1 carb serving). Snack  1 cup carrots.   cup hummus (1 carb serving). Dinner  3 oz broiled salmon.  1 cup brown rice (3 carb servings). Snack  1  cups steamed broccoli (1 carb serving) drizzled with 1 tsp olive oil and lemon juice.  1 cup light pudding (2 carb servings). DIABETES MEAL PLANNING WORKSHEET Your dietician can use this worksheet to help you decide how many servings of foods and what types of foods are right for you.  BREAKFAST Food Group and Servings / Carb Servings Grain/Starches __________________________________ Dairy __________________________________________ Vegetable ______________________________________ Fruit ___________________________________________ Meat __________________________________________ Fat ____________________________________________ LUNCH Food Group and Servings / Carb Servings Grain/Starches ___________________________________ Dairy ___________________________________________ Fruit  ____________________________________________ Meat ___________________________________________ Fat _____________________________________________ Laural [place] Food Group and Servings / Carb Servings Grain/Starches ___________________________________ Dairy ___________________________________________ Fruit ____________________________________________ Meat ___________________________________________ Fat _____________________________________________ SNACKS Food Group and Servings / Carb Servings Grain/Starches ___________________________________ Dairy ___________________________________________ Vegetable _______________________________________ Fruit ____________________________________________ Meat ___________________________________________ Fat _____________________________________________ DAILY TOTALS Starches _________________________ Vegetable ________________________ Fruit ____________________________ Dairy ____________________________ Meat ____________________________ Fat ______________________________ Document Released: 06/26/2005 Document Revised: 12/22/2011 Document Reviewed: 05/07/2009 [employer] Patient Information 2014 [employer].

## 2013-06-29 LAB — HEPATIC FUNCTION PANEL
ALT: 19 U/L (ref 0–35)
AST: 24 U/L (ref 0–37)
Albumin: 4.3 g/dL (ref 3.5–5.2)
Alkaline Phosphatase: 60 U/L (ref 39–117)
Total Bilirubin: 0.6 mg/dL (ref 0.3–1.2)
Total Protein: 7 g/dL (ref 6.0–8.3)

## 2013-06-29 LAB — LIPID PANEL
HDL: 55 mg/dL (ref >39)
Total CHOL/HDL Ratio: 3.4 Ratio
Triglycerides: 105 mg/dL (ref <150)

## 2013-06-29 LAB — BASIC METABOLIC PANEL
Calcium: 10.3 mg/dL (ref 8.4–10.5)
Creat: 1.1 mg/dL (ref 0.50–1.10)
Sodium: 138 mEq/L (ref 135–145)

## 2013-07-01 MED ORDER — POTASSIUM CHLORIDE CRYS ER 20 MEQ PO TBCR: 20.0000 meq | EXTENDED_RELEASE_TABLET | Freq: Two times a day (BID) | ORAL | Status: DC

## 2013-07-01 NOTE — Addendum Note (Signed)
Addended by: Metro Kung on: 07/01/2013 05:30 PM   Modules accepted: Orders

## 2013-07-14 ENCOUNTER — Telehealth: Payer: Self-pay | Admitting: Family Medicine

## 2013-07-14 NOTE — Telephone Encounter (Signed)
Patient has Diabetic Testing results for Dr. Roby Lofts review.  These results are attached to patient's chart placed in Dr. Roby Lofts in-basket  Call Patient if needed.

## 2013-07-16 ENCOUNTER — Other Ambulatory Visit: Payer: Self-pay | Admitting: Family Medicine

## 2013-07-16 LAB — LIPID PANEL
Cholesterol: 149 mg/dL (ref 0–200)
HDL: 51 mg/dL (ref >39)
LDL Cholesterol: 83 mg/dL (ref 0–99)
Total CHOL/HDL Ratio: 2.9 Ratio
Triglycerides: 74 mg/dL (ref <150)
VLDL: 15 mg/dL (ref 0–40)

## 2013-07-16 LAB — HEPATIC FUNCTION PANEL
ALT: 13 U/L (ref 0–35)
Albumin: 4.1 g/dL (ref 3.5–5.2)
Alkaline Phosphatase: 44 U/L (ref 39–117)
Bilirubin, Direct: 0.1 mg/dL (ref 0.0–0.3)
Total Protein: 6.6 g/dL (ref 6.0–8.3)

## 2013-07-16 LAB — BASIC METABOLIC PANEL
BUN: 14 mg/dL (ref 6–23)
CO2: 30 mEq/L (ref 19–32)
Chloride: 103 mEq/L (ref 96–112)
Potassium: 3.6 mEq/L (ref 3.5–5.3)
Sodium: 139 mEq/L (ref 135–145)

## 2013-07-16 LAB — MAGNESIUM: Magnesium: 1.8 mg/dL (ref 1.5–2.5)

## 2013-07-20 ENCOUNTER — Ambulatory Visit (INDEPENDENT_AMBULATORY_CARE_PROVIDER_SITE_OTHER): Payer: BC Managed Care – PPO | Admitting: Family Medicine

## 2013-07-20 ENCOUNTER — Encounter: Payer: Self-pay | Admitting: Family Medicine

## 2013-07-20 VITALS — BP 128/70 | Ht 66.0 in | Wt 183.0 lb

## 2013-07-20 DIAGNOSIS — I10 Essential (primary) hypertension: Secondary | ICD-10-CM

## 2013-07-20 DIAGNOSIS — E119 Type 2 diabetes mellitus without complications: Secondary | ICD-10-CM

## 2013-07-20 DIAGNOSIS — Z23 Encounter for immunization: Secondary | ICD-10-CM

## 2013-07-20 DIAGNOSIS — E785 Hyperlipidemia, unspecified: Secondary | ICD-10-CM

## 2013-07-20 NOTE — Progress Notes (Signed)
  Subjective:    Patient ID: Tracy Spencer, female    DOB: 1951/07/01, 62 y.o.   MRN: [id number]  HPI  Patient arrives to follow up on potassium and sugar. Patient wants to discuss recent labs. Went over her labs went over her recent blood work overall this all looks good. She was encouraged to maintain a healthy diet and take her medications. Review of Systems She denies chest tightness pressure pain shortness    Objective:   Physical Exam Lungs are clear hearts regular pulse normal foot exam normal       Assessment & Plan:  Diabetes is making progress. Followup again later this year check hemoglobin A1c again. HTN fair control patient will check blood pressure periodically and send [place] the notes Hyperlipidemia good control continue current measures

## 2013-07-20 NOTE — Patient Instructions (Signed)
142/88 today  Send me readings in 3 to 4 weeks  DASH Diet The DASH diet stands for "Dietary Approaches to Stop Hypertension." It is a healthy eating plan that has been shown to reduce high blood pressure (hypertension) in as little as 14 days, while also possibly providing other significant health benefits. These other health benefits include reducing the risk of breast cancer after menopause and reducing the risk of type 2 diabetes, heart disease, colon cancer, and stroke. Health benefits also include weight loss and slowing kidney failure in patients with chronic kidney disease.  DIET GUIDELINES  Limit salt (sodium). Your diet should contain less than 1500 mg of sodium daily.  Limit refined or processed carbohydrates. Your diet should include mostly whole grains. Desserts and added sugars should be used sparingly.  Include small amounts of heart-healthy fats. These types of fats include nuts, oils, and tub margarine. Limit saturated and trans fats. These fats have been shown to be harmful in the body. CHOOSING FOODS  The following food groups are based on a 2000 calorie diet. See your Registered Dietitian for individual calorie needs. Grains and Grain Products (6 to 8 servings daily)  Eat More Often: Whole-wheat bread, brown rice, whole-grain or wheat pasta, quinoa, popcorn without added fat or salt (air popped).  Eat Less Often: White bread, white pasta, white rice, cornbread. Vegetables (4 to 5 servings daily)  Eat More Often: Fresh, frozen, and canned vegetables. Vegetables may be raw, steamed, roasted, or grilled with a minimal amount of fat.  Eat Less Often/Avoid: Creamed or fried vegetables. Vegetables in a cheese sauce. Fruit (4 to 5 servings daily)  Eat More Often: All fresh, canned (in natural juice), or frozen fruits. Dried fruits without added sugar. One hundred percent fruit juice ( cup [237 mL] daily).  Eat Less Often: Dried fruits with added sugar. Canned fruit in light  or heavy syrup. [employer], Fish, and Poultry (2 servings or less daily. One serving is 3 to 4 oz [85-114 g]).  Eat More Often: Ninety percent or leaner ground beef, tenderloin, sirloin. Round cuts of beef, chicken breast, [place] breast. All fish. Grill, bake, or broil your meat. Nothing should be fried.  Eat Less Often/Avoid: Fatty cuts of meat, [place], or chicken leg, thigh, or wing. Fried cuts of meat or fish. Dairy (2 to 3 servings)  Eat More Often: Low-fat or fat-free milk, low-fat plain or light yogurt, reduced-fat or part-skim cheese.  Eat Less Often/Avoid: Milk (whole, 2%).Whole milk yogurt. Full-fat cheeses. Nuts, Seeds, and Legumes (4 to 5 servings per week)  Eat More Often: All without added salt.  Eat Less Often/Avoid: Salted nuts and seeds, canned beans with added salt. Fats and Sweets (limited)  Eat More Often: Vegetable oils, tub margarines without trans fats, sugar-free gelatin. Mayonnaise and salad dressings.  Eat Less Often/Avoid: Coconut oils, palm oils, butter, stick margarine, cream, half and half, cookies, candy, pie. FOR MORE INFORMATION The Dash Diet Eating Plan: www.dashdiet.org Document Released: 09/18/2011 Document Revised: 12/22/2011 Document Reviewed: 09/18/2011 [facility] Patient Information 2014 [place], [place].

## 2013-07-23 ENCOUNTER — Other Ambulatory Visit: Payer: Self-pay | Admitting: Family Medicine

## 2013-08-17 ENCOUNTER — Other Ambulatory Visit: Payer: Self-pay | Admitting: Family Medicine

## 2013-08-18 ENCOUNTER — Other Ambulatory Visit: Payer: Self-pay | Admitting: Family Medicine

## 2013-09-19 ENCOUNTER — Other Ambulatory Visit: Payer: Self-pay | Admitting: Family Medicine

## 2013-09-27 ENCOUNTER — Ambulatory Visit (INDEPENDENT_AMBULATORY_CARE_PROVIDER_SITE_OTHER): Payer: BC Managed Care – PPO | Admitting: Family Medicine

## 2013-09-27 ENCOUNTER — Encounter: Payer: Self-pay | Admitting: Family Medicine

## 2013-09-27 VITALS — BP 134/84 | Ht 66.0 in | Wt 183.6 lb

## 2013-09-27 DIAGNOSIS — E119 Type 2 diabetes mellitus without complications: Secondary | ICD-10-CM

## 2013-09-27 DIAGNOSIS — E785 Hyperlipidemia, unspecified: Secondary | ICD-10-CM

## 2013-09-27 DIAGNOSIS — I10 Essential (primary) hypertension: Secondary | ICD-10-CM

## 2013-09-27 LAB — POCT GLYCOSYLATED HEMOGLOBIN (HGB A1C): Hemoglobin A1C: 5.9

## 2013-09-27 NOTE — Patient Instructions (Signed)
Reduce Glipizide to 1/2 tablet twice a day  Follow up in April

## 2013-09-27 NOTE — Progress Notes (Signed)
   Subjective:    Patient ID: Tracy Spencer, female    DOB: 16-Jun-1951, 62 y.o.   MRN: [id number]  HPI Patient is here today for diabetic check up. She states blood sugars are WNL.  Occasionally has sugars that are in the 70s and 80s but never has any true low spells The patient was seen today as part of a comprehensive diabetic check up. The patient had the following elements completed: -Review of medication compliance -Review of glucose monitoring results -Review of any complications do to high or low sugars -Diabetic foot exam was completed as part of today's visit. The following was also discussed: -Importance of yearly eye exams -Importance of following diabetic/low sugar-starch diet -Importance of exercise and regular activity -Importance of regular followup visits. -Most recent hemoglobin A1c were reviewed with the patient along with goals regarding diabetes.  Long discussion held regarding blood pressure cholesterol diabetes. The importance of watching diet watching portions minimizing her weight. Exercising. All these were discussed in detail. Patient unfortunately not doing quite as good in exercise she will try to improve this. PMH benign Family history noncontributory Review of Systems Denies headaches wheezing. Denies joint pain.   denies chest tightness pressure pain shortness breath nausea vomiting Objective:   Physical Exam  Lungs clear heart regular extremities no edema diabetic foot exam normal pulses normal      Assessment & Plan:  #1 diabetes very good control continue current measures reduce glipizide a half tablet twice daily #2 HTN good control #3 lab work extensive in 4-6 months

## 2013-09-27 NOTE — Addendum Note (Signed)
Addended by: Lilyan Punt A on: 09/27/2013 09:35 PM   Modules accepted: Level of Service

## 2013-10-24 ENCOUNTER — Other Ambulatory Visit: Payer: Self-pay | Admitting: Family Medicine

## 2013-11-29 ENCOUNTER — Other Ambulatory Visit: Payer: Self-pay | Admitting: Family Medicine

## 2013-12-22 ENCOUNTER — Other Ambulatory Visit: Payer: Self-pay | Admitting: Family Medicine

## 2014-01-20 ENCOUNTER — Encounter: Payer: Self-pay | Admitting: Family Medicine

## 2014-01-20 ENCOUNTER — Ambulatory Visit (INDEPENDENT_AMBULATORY_CARE_PROVIDER_SITE_OTHER): Payer: BC Managed Care – PPO | Admitting: Family Medicine

## 2014-01-20 VITALS — BP 140/88 | Ht 66.0 in | Wt 186.0 lb

## 2014-01-20 DIAGNOSIS — E785 Hyperlipidemia, unspecified: Secondary | ICD-10-CM

## 2014-01-20 DIAGNOSIS — I10 Essential (primary) hypertension: Secondary | ICD-10-CM

## 2014-01-20 DIAGNOSIS — Z23 Encounter for immunization: Secondary | ICD-10-CM

## 2014-01-20 DIAGNOSIS — Z79899 Other long term (current) drug therapy: Secondary | ICD-10-CM

## 2014-01-20 DIAGNOSIS — E119 Type 2 diabetes mellitus without complications: Secondary | ICD-10-CM

## 2014-01-20 LAB — POCT GLYCOSYLATED HEMOGLOBIN (HGB A1C): Hemoglobin A1C: 5.6

## 2014-01-20 NOTE — Progress Notes (Signed)
   Subjective:    Patient ID: Tracy Spencer, female    DOB: 03/24/1951, 63 y.o.   MRN: [id number]  Diabetes She presents for her follow-up diabetic visit. She has type 2 diabetes mellitus. Hypoglycemia symptoms include nervousness/anxiousness. Associated symptoms include foot paresthesias. Pertinent negatives for diabetes include no chest pain. Current diabetic treatment includes oral agent (dual therapy). She is compliant with treatment all of the time. She participates in exercise daily. Her breakfast blood glucose range is generally 90-110 mg/dl. Her dinner blood glucose range is generally 110-130 mg/dl.      Review of Systems  Constitutional: Negative for fever and activity change.  HENT: Negative for congestion, ear pain and rhinorrhea.   Eyes: Negative for discharge.  Respiratory: Negative for cough, shortness of breath and wheezing.   Cardiovascular: Negative for chest pain.  Gastrointestinal: Negative for abdominal pain.  Psychiatric/Behavioral: The patient is nervous/anxious.        Objective:   Physical Exam  Vitals reviewed. Constitutional: She appears well-nourished. No distress.  Cardiovascular: Normal rate, regular rhythm and normal heart sounds.   No murmur heard. Pulmonary/Chest: Effort normal and breath sounds normal. No respiratory distress.  Abdominal: Soft.  Musculoskeletal: She exhibits no edema.  Lymphadenopathy:    She has no cervical adenopathy.  Neurological: She is alert. She exhibits normal muscle tone.  Psychiatric: Her behavior is normal.    Diabetic foot exam completed      Assessment & Plan:  #1 diabetes-excellent control continue current measures followup if ongoing troubles warnings discussed  #2 HTN fair control discussed diet exercise continue medications.  Hyperlipidemia check lab work await results  Followup again in 6 months time  Greater than half the time spent in counseling regarding the diabetes hypertension hyperlipidemia  and reduction of risk factors 992 10/17/2023 minutes

## 2014-01-24 ENCOUNTER — Other Ambulatory Visit: Payer: Self-pay | Admitting: Family Medicine

## 2014-01-24 LAB — HEPATIC FUNCTION PANEL
ALK PHOS: 44 U/L (ref 39–117)
ALT: 13 U/L (ref 0–35)
AST: 18 U/L (ref 0–37)
Albumin: 4.4 g/dL (ref 3.5–5.2)
Bilirubin, Direct: 0.1 mg/dL (ref 0.0–0.3)
Indirect Bilirubin: 0.4 mg/dL (ref 0.2–1.2)
TOTAL PROTEIN: 7.2 g/dL (ref 6.0–8.3)
Total Bilirubin: 0.5 mg/dL (ref 0.2–1.2)

## 2014-01-24 LAB — LIPID PANEL
Cholesterol: 159 mg/dL (ref 0–200)
HDL: 60 mg/dL (ref >39)
LDL CALC: 85 mg/dL (ref 0–99)
Total CHOL/HDL Ratio: 2.7 Ratio
Triglycerides: 72 mg/dL (ref <150)
VLDL: 14 mg/dL (ref 0–40)

## 2014-01-24 LAB — BASIC METABOLIC PANEL
BUN: 15 mg/dL (ref 6–23)
CO2: 30 meq/L (ref 19–32)
Calcium: 9.8 mg/dL (ref 8.4–10.5)
Chloride: 101 mEq/L (ref 96–112)
Creat: 0.97 mg/dL (ref 0.50–1.10)
GLUCOSE: 92 mg/dL (ref 70–99)
POTASSIUM: 3.7 meq/L (ref 3.5–5.3)
SODIUM: 139 meq/L (ref 135–145)

## 2014-01-25 LAB — MICROALBUMIN, URINE: Microalb, Ur: 0.71 mg/dL (ref 0.00–1.89)

## 2014-01-31 LAB — RENIN: Renin Activity: 0.21 ng/mL/h — ABNORMAL LOW (ref 0.25–5.82)

## 2014-02-02 ENCOUNTER — Other Ambulatory Visit: Payer: Self-pay | Admitting: Family Medicine

## 2014-02-20 LAB — HM DIABETES EYE EXAM

## 2014-02-22 ENCOUNTER — Telehealth: Payer: Self-pay | Admitting: Family Medicine

## 2014-02-22 NOTE — Telephone Encounter (Signed)
Discussed with patient. Patient verbalized understanding. 

## 2014-02-22 NOTE — Telephone Encounter (Signed)
Pt calling to say she is having like a tingly/tightening to her muscles at times Wants to know if this is a side affect of crestor?

## 2014-02-22 NOTE — Telephone Encounter (Signed)
It is unlikely that this is due to the Crestor. When Crestor causes muscle inflammation and is generally significant muscle soreness in all major muscle groups at one time. If patient continues to have issues she might want to consider following up for an further evaluation and discussion

## 2014-03-10 ENCOUNTER — Other Ambulatory Visit: Payer: Self-pay | Admitting: Family Medicine

## 2014-03-10 DIAGNOSIS — Z1231 Encounter for screening mammogram for malignant neoplasm of breast: Secondary | ICD-10-CM

## 2014-03-23 ENCOUNTER — Other Ambulatory Visit: Payer: Self-pay | Admitting: Family Medicine

## 2014-04-13 ENCOUNTER — Ambulatory Visit (HOSPITAL_COMMUNITY)
Admission: RE | Admit: 2014-04-13 | Discharge: 2014-04-13 | Disposition: A | Discharge status: Home / Self Care | Payer: Medicare Other | Source: Ambulatory Visit | Attending: Family Medicine | Admitting: Family Medicine

## 2014-04-13 DIAGNOSIS — Z1231 Encounter for screening mammogram for malignant neoplasm of breast: Secondary | ICD-10-CM

## 2014-04-24 ENCOUNTER — Other Ambulatory Visit: Payer: Self-pay | Admitting: Family Medicine

## 2014-05-22 ENCOUNTER — Other Ambulatory Visit: Payer: Self-pay | Admitting: Family Medicine

## 2014-05-25 ENCOUNTER — Other Ambulatory Visit: Payer: Self-pay | Admitting: Family Medicine

## 2014-06-14 ENCOUNTER — Encounter: Payer: Self-pay | Admitting: Family Medicine

## 2014-06-14 ENCOUNTER — Ambulatory Visit (INDEPENDENT_AMBULATORY_CARE_PROVIDER_SITE_OTHER): Payer: Medicare Other | Admitting: Family Medicine

## 2014-06-14 VITALS — BP 150/90 | Ht 66.0 in | Wt 188.0 lb

## 2014-06-14 DIAGNOSIS — M5412 Radiculopathy, cervical region: Secondary | ICD-10-CM

## 2014-06-14 DIAGNOSIS — E785 Hyperlipidemia, unspecified: Secondary | ICD-10-CM

## 2014-06-14 DIAGNOSIS — M5010 Cervical disc disorder with radiculopathy, unspecified cervical region: Secondary | ICD-10-CM

## 2014-06-14 DIAGNOSIS — Z79899 Other long term (current) drug therapy: Secondary | ICD-10-CM

## 2014-06-14 DIAGNOSIS — E119 Type 2 diabetes mellitus without complications: Secondary | ICD-10-CM

## 2014-06-14 MED ORDER — MELOXICAM 15 MG PO TABS: 15.0000 mg | ORAL_TABLET | Freq: Every day | ORAL | Status: DC

## 2014-06-14 NOTE — Progress Notes (Signed)
   Subjective:    Patient ID: Tracy Spencer, female    DOB: [birth year], 63 y.o.   MRN: [id number]  Shoulder Pain  The pain is present in the left shoulder. This is a new problem. The current episode started more than 1 month ago. She has tried acetaminophen for the symptoms. The treatment provided mild relief.  Patient is concerned about cause of shoulder pain. Patient wonders if it may be her medications.  She is worried that this could be her shoulder.  Review of Systems    denies sweats chills nausea vomiting fevers. Relates the pain discomfort left trapezius into tricep Objective:   Physical Exam She has good range of motion of the shoulder and no signs on physical exam: Torn rotator cuff Her strength is good bilateral reflexes good lungs clear heart regular she has subjective tenderness left trapezius into the left tricep      Assessment & Plan:  Liver function recommended along with cholesterol panel because of her health history  Diabetes metabolic 7 as well as hemoglobin A1c recommended low starch diet followup for discussion of this in several weeks  Left trapezius pain that radiates into the left tricep suspect the possibility of a pinched nerve recommend Mobic daily for the next few weeks, if not doing significantly better by the time she follows up may need imaging studies of her neck for

## 2014-06-23 ENCOUNTER — Other Ambulatory Visit: Payer: Self-pay | Admitting: Family Medicine

## 2014-07-07 LAB — HEPATIC FUNCTION PANEL
ALBUMIN: 4.4 g/dL (ref 3.5–5.2)
ALK PHOS: 48 U/L (ref 39–117)
ALT: 17 U/L (ref 0–35)
AST: 21 U/L (ref 0–37)
Bilirubin, Direct: 0.1 mg/dL (ref 0.0–0.3)
Indirect Bilirubin: 0.4 mg/dL (ref 0.2–1.2)
Total Bilirubin: 0.5 mg/dL (ref 0.2–1.2)
Total Protein: 7.5 g/dL (ref 6.0–8.3)

## 2014-07-07 LAB — LIPID PANEL
CHOL/HDL RATIO: 2.6 ratio
CHOLESTEROL: 166 mg/dL (ref 0–200)
HDL: 64 mg/dL (ref >39)
LDL Cholesterol: 88 mg/dL (ref 0–99)
Triglycerides: 70 mg/dL (ref <150)
VLDL: 14 mg/dL (ref 0–40)

## 2014-07-07 LAB — BASIC METABOLIC PANEL
BUN: 13 mg/dL (ref 6–23)
CALCIUM: 9.9 mg/dL (ref 8.4–10.5)
CO2: 30 mEq/L (ref 19–32)
Chloride: 102 mEq/L (ref 96–112)
Creat: 0.94 mg/dL (ref 0.50–1.10)
GLUCOSE: 117 mg/dL — AB (ref 70–99)
Potassium: 3.9 mEq/L (ref 3.5–5.3)
Sodium: 140 mEq/L (ref 135–145)

## 2014-07-07 LAB — HEMOGLOBIN A1C
Hgb A1c MFr Bld: 5.9 % — ABNORMAL HIGH (ref <5.7)
Mean Plasma Glucose: 123 mg/dL — ABNORMAL HIGH (ref <117)

## 2014-07-18 ENCOUNTER — Ambulatory Visit (INDEPENDENT_AMBULATORY_CARE_PROVIDER_SITE_OTHER): Payer: Medicare Other | Admitting: Family Medicine

## 2014-07-18 ENCOUNTER — Encounter: Payer: Self-pay | Admitting: Family Medicine

## 2014-07-18 VITALS — BP 138/88 | Ht 66.0 in | Wt 190.0 lb

## 2014-07-18 DIAGNOSIS — B351 Tinea unguium: Secondary | ICD-10-CM

## 2014-07-18 DIAGNOSIS — E785 Hyperlipidemia, unspecified: Secondary | ICD-10-CM

## 2014-07-18 DIAGNOSIS — Z23 Encounter for immunization: Secondary | ICD-10-CM

## 2014-07-18 DIAGNOSIS — N1831 Chronic kidney disease, stage 3a: Secondary | ICD-10-CM | POA: Insufficient documentation

## 2014-07-18 DIAGNOSIS — E119 Type 2 diabetes mellitus without complications: Secondary | ICD-10-CM | POA: Insufficient documentation

## 2014-07-18 DIAGNOSIS — M79676 Pain in unspecified toe(s): Secondary | ICD-10-CM

## 2014-07-18 DIAGNOSIS — I10 Essential (primary) hypertension: Secondary | ICD-10-CM

## 2014-07-18 DIAGNOSIS — Z13820 Encounter for screening for osteoporosis: Secondary | ICD-10-CM

## 2014-07-18 DIAGNOSIS — E1122 Type 2 diabetes mellitus with diabetic chronic kidney disease: Secondary | ICD-10-CM | POA: Insufficient documentation

## 2014-07-18 MED ORDER — METFORMIN HCL 500 MG PO TABS: ORAL_TABLET | ORAL | Status: DC

## 2014-07-18 MED ORDER — POTASSIUM CHLORIDE CRYS ER 20 MEQ PO TBCR: EXTENDED_RELEASE_TABLET | ORAL | Status: DC

## 2014-07-18 MED ORDER — CETIRIZINE HCL 10 MG PO TABS: 10.0000 mg | ORAL_TABLET | Freq: Every day | ORAL | Status: AC

## 2014-07-18 MED ORDER — GLIPIZIDE 5 MG PO TABS: 2.5000 mg | ORAL_TABLET | Freq: Two times a day (BID) | ORAL | Status: DC

## 2014-07-18 MED ORDER — LOSARTAN POTASSIUM-HCTZ 100-25 MG PO TABS: ORAL_TABLET | ORAL | Status: DC

## 2014-07-18 MED ORDER — AMLODIPINE BESYLATE 5 MG PO TABS: 2.5000 mg | ORAL_TABLET | Freq: Every day | ORAL | Status: DC

## 2014-07-18 MED ORDER — ROSUVASTATIN CALCIUM 40 MG PO TABS: ORAL_TABLET | ORAL | Status: DC

## 2014-07-18 NOTE — Progress Notes (Signed)
   Subjective:    Patient ID: Tracy Spencer, female    DOB: 12-07-1950, 63 y.o.   MRN: [id number]  Diabetes She presents for her follow-up diabetic visit. She has type 2 diabetes mellitus. Pertinent negatives for hypoglycemia include no confusion. Pertinent negatives for diabetes include no chest pain, no fatigue, no polydipsia, no polyphagia and no weakness. She is compliant with treatment all of the time. She is following a diabetic diet. She participates in exercise three times a week. Her breakfast blood glucose range is generally 90-110 mg/dl. She does not see a podiatrist.Eye exam is current.   A1C on bloodwork. 07/07/14 result was 5.9. Check right great toenail. Pt wants to have it removed.  Stomach feels bloated since starting diabetic meds.  Requesting 90 day supply on meds.  Flu vaccine today.  Review of Systems  Constitutional: Negative for activity change, appetite change and fatigue.  HENT: Negative for congestion.   Respiratory: Negative for cough and shortness of breath.   Cardiovascular: Negative for chest pain.  Gastrointestinal: Negative for abdominal pain.  Endocrine: Negative for polydipsia and polyphagia.  Genitourinary: Negative for frequency.  Neurological: Negative for weakness.  Psychiatric/Behavioral: Negative for confusion.       Objective:   Physical Exam  Vitals reviewed. Constitutional: She appears well-nourished. No distress.  Cardiovascular: Normal rate, regular rhythm and normal heart sounds.   No murmur heard. Pulmonary/Chest: Effort normal and breath sounds normal. No respiratory distress.  Musculoskeletal: She exhibits no edema.  Lymphadenopathy:    She has no cervical adenopathy.  Neurological: She is alert. She exhibits normal muscle tone.  Psychiatric: Her behavior is normal.          Assessment & Plan:  #1 diabetes overall has good control watch diet closely compliant with medicines stay physically active. #2 bone density screening  ordered #3 podiatry referral because of thickened toenails and pain and discomfort with her toenails #4 pneumonia vaccine today. #5 the importance of keeping cholesterol, blood pressure, diabetes under good control were discussed in detail. 25 minutes spent with patient discussing the many aspects of diabetes as well as the other care covered. Follow up again in mid-March

## 2014-07-18 NOTE — Patient Instructions (Signed)
81 mg Aspirin one daily. This helps reduce your risk of heart attacks and strokes. If it bothers your stomach simply stop it.  Follow up in March sooner if you need us.

## 2014-07-24 ENCOUNTER — Ambulatory Visit: Payer: BC Managed Care – PPO | Admitting: Family Medicine

## 2014-07-25 ENCOUNTER — Encounter: Payer: Self-pay | Admitting: Family Medicine

## 2014-08-09 ENCOUNTER — Ambulatory Visit (HOSPITAL_COMMUNITY)
Admission: RE | Admit: 2014-08-09 | Discharge: 2014-08-09 | Disposition: A | Discharge status: Home / Self Care | Payer: Medicare Other | Source: Ambulatory Visit | Attending: Family Medicine | Admitting: Family Medicine

## 2014-08-09 DIAGNOSIS — Z13820 Encounter for screening for osteoporosis: Secondary | ICD-10-CM | POA: Insufficient documentation

## 2014-08-09 NOTE — Progress Notes (Signed)
Patient notified and verbalized understanding. She said she will call us back to schedule an appt to discuss with Dr. Lorin PicketScott

## 2014-08-12 ENCOUNTER — Other Ambulatory Visit: Payer: Self-pay | Admitting: Family Medicine

## 2014-08-14 ENCOUNTER — Encounter: Payer: Self-pay | Admitting: Family Medicine

## 2014-08-14 ENCOUNTER — Ambulatory Visit (INDEPENDENT_AMBULATORY_CARE_PROVIDER_SITE_OTHER): Payer: Medicare Other | Admitting: Family Medicine

## 2014-08-14 VITALS — BP 142/86 | Temp 98.8°F | Ht 66.0 in | Wt 189.0 lb

## 2014-08-14 DIAGNOSIS — J0120 Acute ethmoidal sinusitis, unspecified: Secondary | ICD-10-CM

## 2014-08-14 DIAGNOSIS — M810 Age-related osteoporosis without current pathological fracture: Secondary | ICD-10-CM | POA: Insufficient documentation

## 2014-08-14 DIAGNOSIS — M816 Localized osteoporosis [Lequesne]: Secondary | ICD-10-CM | POA: Insufficient documentation

## 2014-08-14 MED ORDER — ALENDRONATE SODIUM 70 MG PO TABS: 70.0000 mg | ORAL_TABLET | ORAL | Status: DC

## 2014-08-14 MED ORDER — AMOXICILLIN-POT CLAVULANATE 875-125 MG PO TABS: 1.0000 | ORAL_TABLET | Freq: Two times a day (BID) | ORAL | Status: DC

## 2014-08-14 NOTE — Progress Notes (Signed)
   Subjective:    Patient ID: Tracy Spencer, female    DOB: March 17, 1951, 63 y.o.   MRN: [id number]  Cough This is a new problem. The current episode started more than 1 month ago. Associated symptoms comments: Runny nose. Treatments tried: tylenol and zyrtec.  stated that it started off a postnasal drip then progressively became more congestion and coughing PMH benign has frequent sinus symptoms   Review of Systems  Respiratory: Positive for cough.   no fever does have discolored drainage moderate amount of coughing     Objective:   Physical Exam  Lungs are clear hearts regular neck no masses moderate sinus tenderness to percussion eardrums normal throat normal      Assessment & Plan:  Probable sinusitis antibiotics prescribed warning signs discussed follow-up of problems  Long discussion held regarding osteoporosis start Fosamax if above is her stop it

## 2014-08-14 NOTE — Patient Instructions (Signed)
Osteoporosis Throughout your life, your body breaks down old bone and replaces it with new bone. As you get older, your body does not replace bone as quickly as it breaks it down. By the age of 30 years, most people begin to gradually lose bone because of the imbalance between bone loss and replacement. Some people lose more bone than others. Bone loss beyond a specified normal degree is considered osteoporosis.  Osteoporosis affects the strength and durability of your bones. The inside of the ends of your bones and your flat bones, like the bones of your pelvis, look like honeycomb, filled with tiny open spaces. As bone loss occurs, your bones become less dense. This means that the open spaces inside your bones become bigger and the walls between these spaces become thinner. This makes your bones weaker. Bones of a person with osteoporosis can become so weak that they can break (fracture) during minor accidents, such as a simple fall. CAUSES  The following factors have been associated with the development of osteoporosis:  Smoking.  Drinking more than 2 alcoholic drinks several days per week.  Long-term use of certain medicines:  Corticosteroids.  Chemotherapy medicines.  Thyroid medicines.  Antiepileptic medicines.  Gonadal hormone suppression medicine.  Immunosuppression medicine.  Being underweight.  Lack of physical activity.  Lack of exposure to the sun. This can lead to vitamin D deficiency.  Certain medical conditions:  Certain inflammatory bowel diseases, such as Crohn disease and ulcerative colitis.  Diabetes.  Hyperthyroidism.  Hyperparathyroidism. RISK FACTORS Anyone can develop osteoporosis. However, the following factors can increase your risk of developing osteoporosis:  Gender--Women are at higher risk than men.  Age--Being older than 50 years increases your risk.  Ethnicity--White and Asian people have an increased risk.  Weight --Being extremely  underweight can increase your risk of osteoporosis.  Family history of osteoporosis--Having a family member who has developed osteoporosis can increase your risk. SYMPTOMS  Usually, people with osteoporosis have no symptoms.  DIAGNOSIS  Signs during a physical exam that may prompt your caregiver to suspect osteoporosis include:  Decreased height. This is usually caused by the compression of the bones that form your spine (vertebrae) because they have weakened and become fractured.  A curving or rounding of the upper back (kyphosis). To confirm signs of osteoporosis, your caregiver may request a procedure that uses 2 low-dose X-ray beams with different levels of energy to measure your bone mineral density (dual-energy X-ray absorptiometry [DXA]). Also, your caregiver may check your level of vitamin D. TREATMENT  The goal of osteoporosis treatment is to strengthen bones in order to decrease the risk of bone fractures. There are different types of medicines available to help achieve this goal. Some of these medicines work by slowing the processes of bone loss. Some medicines work by increasing bone density. Treatment also involves making sure that your levels of calcium and vitamin D are adequate. PREVENTION  There are things you can do to help prevent osteoporosis. Adequate intake of calcium and vitamin D can help you achieve optimal bone mineral density. Regular exercise can also help, especially resistance and weight-bearing activities. If you smoke, quitting smoking is an important part of osteoporosis prevention. MAKE SURE YOU:  Understand these instructions.  Will watch your condition.  Will get help right away if you are not doing well or get worse. FOR MORE INFORMATION www.osteo.org and www.nof.org Document Released: 07/09/2005 Document Revised: 01/24/2013 Document Reviewed: 09/13/2011 [employer] Patient Information 2015 [employer]. This information is not   intended to replace advice  given to you by your health care provider. Make sure you discuss any questions you have with your health care provider.  

## 2014-09-26 ENCOUNTER — Encounter: Payer: Self-pay | Admitting: Orthopedic Surgery

## 2014-09-26 ENCOUNTER — Ambulatory Visit (INDEPENDENT_AMBULATORY_CARE_PROVIDER_SITE_OTHER): Payer: Medicare Other | Admitting: Orthopedic Surgery

## 2014-09-26 ENCOUNTER — Ambulatory Visit (INDEPENDENT_AMBULATORY_CARE_PROVIDER_SITE_OTHER): Payer: Medicare Other

## 2014-09-26 VITALS — BP 176/95 | Ht 66.0 in | Wt 189.0 lb

## 2014-09-26 DIAGNOSIS — M25512 Pain in left shoulder: Secondary | ICD-10-CM

## 2014-09-26 DIAGNOSIS — M7552 Bursitis of left shoulder: Secondary | ICD-10-CM

## 2014-09-26 NOTE — Progress Notes (Signed)
Patient ID: Tracy HeadlandGeneva S Porath, female   DOB: 17-Apr-1951, 63 y.o.   MRN: [id number] Patient ID: Tracy HeadlandGeneva S Suto, female   DOB: 17-Apr-1951, 63 y.o.   MRN: [id number]  Chief Complaint  Patient presents with  . Shoulder Pain    left shoulder pain, no known injury     Tracy Spencer is a 63 y.o. female who presents with several month history of mild sharp dull aching. Acromial and peri-deltoid shoulder pain with no history of trauma. Pain unrelieved by Tylenol. She does experience night pain and she does have pain with forward elevation. Review of Systems Review of Systems Sinusitis seasonal allergies denies numbness abdominal distention urination problems polydipsia or shortness of breath irregular heartbeat  Past Medical History  Diagnosis Date  . Hypertension   . Eczema   . Prediabetes   . Allergic rhinitis     Past Surgical History  Procedure Laterality Date  . Tubal ligation    . Abdominal hysterectomy    . Colonoscopy      Family History  Problem Relation Age of Onset  . Hypertension Mother   . Hypertension Father     Social History History  Substance Use Topics  . Smoking status: Never Smoker   . Smokeless tobacco: Not on file  . Alcohol Use: Not on file    Allergies  Allergen Reactions  . Naproxen Other (See Comments)    Vomiting  . Lisinopril Cough    Current Outpatient Prescriptions  Medication Sig Dispense Refill  . amLODipine (NORVASC) 5 MG tablet Take 0.5 tablets (2.5 mg total) by mouth daily. 45 tablet 1  . aspirin 81 MG tablet Take 81 mg by mouth daily.    . calcium carbonate (OS-CAL) 600 MG TABS tablet Take 600 mg by mouth 2 (two) times daily with a meal.    . cetirizine (ZYRTEC) 10 MG tablet Take 1 tablet (10 mg total) by mouth daily. 90 tablet 1  . fish oil-omega-3 fatty acids 1000 MG capsule Take 1 g by mouth daily. Take one bid    . glipiZIDE (GLUCOTROL) 5 MG tablet Take 0.5 tablets (2.5 mg total) by mouth 2 (two) times daily before a meal. 90  tablet 1  . losartan-hydrochlorothiazide (HYZAAR) 100-25 MG per tablet TAKE 1 TABLET BY MOUTH EVERY DAY 90 tablet 1  . metFORMIN (GLUCOPHAGE) 500 MG tablet TAKE 1 TABLET BY MOUTH TWICE DAILY WITH MEALS 180 tablet 1  . ONE TOUCH ULTRA TEST test strip USE TO TEST BLOOD SUGAR EVERY DAY 100 each 0  . ONETOUCH DELICA LANCETS 33G MISC USE TO TEST BLOOD SUGAR EVERY DAY 100 each 2  . potassium chloride SA (K-DUR,KLOR-CON) 20 MEQ tablet TAKE 1 TABLET BY MOUTH TWICE DAILY 180 tablet 1  . Probiotic Product (ADVANCED PROBIOTIC 10 PO) Take by mouth.    . rosuvastatin (CRESTOR) 40 MG tablet TAKE 1 TABLET BY MOUTH EVERY DAY 90 tablet 1  . alendronate (FOSAMAX) 70 MG tablet Take 1 tablet (70 mg total) by mouth every 7 (seven) days. Take with a full glass of water on an empty stomach. (Patient not taking: Reported on 09/26/2014) 4 tablet 11  . amoxicillin-clavulanate (AUGMENTIN) 875-125 MG per tablet Take 1 tablet by mouth 2 (two) times daily. (Patient not taking: Reported on 09/26/2014) 28 tablet 0   No current facility-administered medications for this visit.       Physical Exam Blood pressure 176/95, height 5\' 6"  (1.676 m), weight 189 lb (85.73 kg). Physical Exam Well-groomed normal  appearance normal in anatomy without deformity  Oriented 3 mood normal affect normal gait normal  She does have palpable tenderness in the. Acromial region but full passive and active range of motion of the shoulder with mild pain at terminal flexion. Negative apprehension test. Positive impingement test. Motor exam normal skin intact good pulses normal sensation C-spine normal     Data Reviewed My interpretation of the xrays: Fairly normal x-ray type II acromion mild glenohumeral spur  Assessment Encounter Diagnoses  Name Primary?  . Left shoulder pain   . Bursitis, shoulder, left Yes    Plan Home exercise program with Codman exercises, reassurance, follow-up as needed

## 2014-09-26 NOTE — Patient Instructions (Addendum)
Bursitis  Bursitis is a condition that involves inflammation of the tendons of the rotator cuff and the subacromial bursa, that causes pain in the shoulder. The rotator cuff consists of four tendons and muscles that control much of the shoulder and upper arm function. The subacromial bursa is a fluid filled sac that helps reduce friction between the rotator cuff and one of the bones of the shoulder (acromion). Impingement syndrome is usually an overuse injury that causes swelling of the bursa (bursitis), swelling of the tendon (tendonitis), and/or a tear of the tendon (strain). Strains are classified into three categories. Grade 1 strains cause pain, but the tendon is not lengthened. Grade 2 strains include a lengthened ligament, due to the ligament being stretched or partially ruptured. With grade 2 strains there is still function, although the function may be decreased. Grade 3 strains include a complete tear of the tendon or muscle, and function is usually impaired. SYMPTOMS   Pain around the shoulder, often at the outer portion of the upper arm.  Pain that gets worse with shoulder function, especially when reaching overhead or lifting.  Sometimes, aching when not using the arm.  Pain that wakes you up at night.  Sometimes, tenderness, swelling, warmth, or redness over the affected area.  Loss of strength.  Limited motion of the shoulder, especially reaching behind the back (to the back pocket or to unhook bra) or across your body.  Crackling sound (crepitation) when moving the arm.  Biceps tendon pain and inflammation (in the front of the shoulder). Worse when bending the elbow or lifting. CAUSES  Impingement syndrome is often an overuse injury, in which chronic (repetitive) motions cause the tendons or bursa to become inflamed. A strain occurs when a force is paced on the tendon or muscle that is greater than it can withstand. Common mechanisms of injury include: Stress from sudden  increase in duration, frequency, or intensity of training.  Direct hit (trauma) to the shoulder.  Aging, erosion of the tendon with normal use.  Bony bump on shoulder (acromial spur). RISK INCREASES WITH:  Contact sports (football, wrestling, boxing).  Throwing sports (baseball, tennis, volleyball).  Weightlifting and bodybuilding.  Heavy labor.  Previous injury to the rotator cuff, including impingement.  Poor shoulder strength and flexibility.  Failure to warm up properly before activity.  Inadequate protective equipment.  Old age.  Bony bump on shoulder (acromial spur). PREVENTION   Warm up and stretch properly before activity.  Allow for adequate recovery between workouts.  Maintain physical fitness:  Strength, flexibility, and endurance.  Cardiovascular fitness.  Learn and use proper exercise technique. PROGNOSIS  If treated properly, impingement syndrome usually goes away within 6 weeks. Sometimes surgery is required.  RELATED COMPLICATIONS   Longer healing time if not properly treated, or if not given enough time to heal.  Recurring symptoms, that result in a chronic condition.  Shoulder stiffness, frozen shoulder, or loss of motion.  Rotator cuff tendon tear.  Recurring symptoms, especially if activity is resumed too soon, with overuse, with a direct blow, or when using poor technique. TREATMENT  Treatment first involves the use of ice and medicine, to reduce pain and inflammation. The use of strengthening and stretching exercises may help reduce pain with activity. These exercises may be performed at home or with a therapist. If non-surgical treatment is unsuccessful after more than 6 months, surgery may be advised. After surgery and rehabilitation, activity is usually possible in 3 months.  MEDICATION  If pain  medicine is needed, nonsteroidal anti-inflammatory medicines (aspirin and ibuprofen), or other minor pain relievers (acetaminophen), are  often advised.  Do not take pain medicine for 7 days before surgery.  Prescription pain relievers may be given, if your caregiver thinks they are needed. Use only as directed and only as much as you need.  Corticosteroid injections may be given by your caregiver. These injections should be reserved for the most serious cases, because they may only be given a certain number of times. HEAT AND COLD  Cold treatment (icing) should be applied for 10 to 15 minutes every 2 to 3 hours for inflammation and pain, and immediately after activity that aggravates your symptoms. Use ice packs or an ice massage.  Heat treatment may be used before performing stretching and strengthening activities prescribed by your caregiver, physical therapist, or athletic trainer. Use a heat pack or a warm water soak. SEEK MEDICAL CARE IF:   Symptoms get worse or do not improve in 4 to 6 weeks, despite treatment.  New, unexplained symptoms develop. (Drugs used in treatment may produce side effects.)

## 2014-11-09 ENCOUNTER — Other Ambulatory Visit: Payer: Self-pay | Admitting: Family Medicine

## 2014-11-25 ENCOUNTER — Other Ambulatory Visit: Payer: Self-pay | Admitting: Family Medicine

## 2014-12-21 ENCOUNTER — Ambulatory Visit: Payer: Self-pay | Admitting: Family Medicine

## 2014-12-22 ENCOUNTER — Ambulatory Visit (INDEPENDENT_AMBULATORY_CARE_PROVIDER_SITE_OTHER): Payer: Medicare Other | Admitting: Family Medicine

## 2014-12-22 ENCOUNTER — Encounter: Payer: Self-pay | Admitting: Family Medicine

## 2014-12-22 VITALS — BP 138/88 | Ht 66.0 in | Wt 192.0 lb

## 2014-12-22 DIAGNOSIS — I10 Essential (primary) hypertension: Secondary | ICD-10-CM

## 2014-12-22 DIAGNOSIS — Z79899 Other long term (current) drug therapy: Secondary | ICD-10-CM

## 2014-12-22 DIAGNOSIS — E785 Hyperlipidemia, unspecified: Secondary | ICD-10-CM | POA: Diagnosis not present

## 2014-12-22 DIAGNOSIS — M810 Age-related osteoporosis without current pathological fracture: Secondary | ICD-10-CM

## 2014-12-22 DIAGNOSIS — E119 Type 2 diabetes mellitus without complications: Secondary | ICD-10-CM | POA: Diagnosis not present

## 2014-12-22 LAB — POCT GLYCOSYLATED HEMOGLOBIN (HGB A1C): HEMOGLOBIN A1C: 6.3

## 2014-12-22 MED ORDER — ROSUVASTATIN CALCIUM 40 MG PO TABS: ORAL_TABLET | ORAL | Status: DC

## 2014-12-22 MED ORDER — METFORMIN HCL 500 MG PO TABS: ORAL_TABLET | ORAL | Status: DC

## 2014-12-22 MED ORDER — LOSARTAN POTASSIUM-HCTZ 100-25 MG PO TABS: ORAL_TABLET | ORAL | Status: DC

## 2014-12-22 MED ORDER — AMLODIPINE BESYLATE 5 MG PO TABS: 5.0000 mg | ORAL_TABLET | Freq: Every day | ORAL | Status: DC

## 2014-12-22 MED ORDER — POTASSIUM CHLORIDE CRYS ER 20 MEQ PO TBCR: EXTENDED_RELEASE_TABLET | ORAL | Status: DC

## 2014-12-22 MED ORDER — GLIPIZIDE 5 MG PO TABS: 2.5000 mg | ORAL_TABLET | Freq: Two times a day (BID) | ORAL | Status: DC

## 2014-12-22 NOTE — Progress Notes (Signed)
   Subjective:    Patient ID: Tracy Spencer, female    DOB: Aug 30, 1951, 64 y.o.   MRN: [id number]  Diabetes She presents for her follow-up diabetic visit. She has type 2 diabetes mellitus. There are no hypoglycemic associated symptoms. Pertinent negatives for hypoglycemia include no confusion. There are no diabetic associated symptoms. Pertinent negatives for diabetes include no chest pain, no fatigue, no polydipsia, no polyphagia and no weakness. Symptoms are stable. Current diabetic treatment includes oral agent (dual therapy). She monitors blood glucose at home 1-2 x per day. Her highest blood glucose is 140-180 mg/dl. She sees a podiatrist.Eye exam is current.      Review of Systems  Constitutional: Negative for activity change, appetite change and fatigue.  HENT: Negative for congestion.   Respiratory: Negative for cough.   Cardiovascular: Negative for chest pain.  Gastrointestinal: Negative for abdominal pain.  Endocrine: Negative for polydipsia and polyphagia.  Neurological: Negative for weakness.  Psychiatric/Behavioral: Negative for confusion.       Objective:   Physical Exam  Constitutional: She appears well-nourished. No distress.  Cardiovascular: Normal rate, regular rhythm and normal heart sounds.   No murmur heard. Pulmonary/Chest: Effort normal and breath sounds normal. No respiratory distress.  Musculoskeletal: She exhibits no edema.  Lymphadenopathy:    She has no cervical adenopathy.  Neurological: She is alert. She exhibits normal muscle tone.  Psychiatric: Her behavior is normal.  Vitals reviewed.         Assessment & Plan:  1. Type 2 diabetes mellitus without complication Diabetes good control continue current measures watch diet closely - POCT glycosylated hemoglobin (Hb A1C) - Microalbumin, urine  2. Essential hypertension, benign Blood pressure acceptable avoid excessive salt stay physically active - Basic metabolic panel  3.  Hyperlipemia History hyperlipidemia check lipid profile await results - Lipid panel  4. Osteoporosis History osteoporosis, patient not taking Fosamax but she agrees to try it one more time and see if she get along better with it.  5. High risk medication use Due to medication uses check liver profile  25 minutes spent patient covering multiple issues follow-up in approximately 3-4 months - Hepatic function panel

## 2014-12-28 ENCOUNTER — Encounter: Payer: Self-pay | Admitting: Family Medicine

## 2014-12-28 LAB — HEPATIC FUNCTION PANEL
ALT: 19 IU/L (ref 0–32)
AST: 19 IU/L (ref 0–40)
Albumin: 4.5 g/dL (ref 3.6–4.8)
Alkaline Phosphatase: 42 IU/L (ref 39–117)
Bilirubin Total: 0.4 mg/dL (ref 0.0–1.2)
Bilirubin, Direct: 0.11 mg/dL (ref 0.00–0.40)
TOTAL PROTEIN: 7.2 g/dL (ref 6.0–8.5)

## 2014-12-28 LAB — BASIC METABOLIC PANEL
BUN/Creatinine Ratio: 11 (ref 11–26)
BUN: 11 mg/dL (ref 8–27)
CHLORIDE: 98 mmol/L (ref 97–108)
CO2: 26 mmol/L (ref 18–29)
CREATININE: 1.01 mg/dL — AB (ref 0.57–1.00)
Calcium: 10 mg/dL (ref 8.7–10.3)
GFR calc Af Amer: 68 mL/min/{1.73_m2} (ref >59)
GFR calc non Af Amer: 59 mL/min/{1.73_m2} — ABNORMAL LOW (ref >59)
GLUCOSE: 105 mg/dL — AB (ref 65–99)
Potassium: 3.7 mmol/L (ref 3.5–5.2)
SODIUM: 140 mmol/L (ref 134–144)

## 2014-12-28 LAB — LIPID PANEL
Chol/HDL Ratio: 2.4 ratio units (ref 0.0–4.4)
Cholesterol, Total: 157 mg/dL (ref 100–199)
HDL: 65 mg/dL (ref >39)
LDL CALC: 78 mg/dL (ref 0–99)
TRIGLYCERIDES: 71 mg/dL (ref 0–149)
VLDL CHOLESTEROL CAL: 14 mg/dL (ref 5–40)

## 2014-12-28 LAB — MICROALBUMIN, URINE

## 2015-01-03 ENCOUNTER — Telehealth: Payer: Self-pay | Admitting: Internal Medicine

## 2015-01-03 NOTE — Telephone Encounter (Signed)
RECALL FOR 10 YR TCS °

## 2015-01-03 NOTE — Telephone Encounter (Signed)
Letter mailed to pt.  

## 2015-01-08 ENCOUNTER — Encounter: Payer: Self-pay | Admitting: Family Medicine

## 2015-01-08 ENCOUNTER — Ambulatory Visit (INDEPENDENT_AMBULATORY_CARE_PROVIDER_SITE_OTHER): Payer: Medicare Other | Admitting: Family Medicine

## 2015-01-08 VITALS — BP 110/68 | Temp 98.8°F | Ht 66.0 in | Wt 191.0 lb

## 2015-01-08 DIAGNOSIS — J019 Acute sinusitis, unspecified: Secondary | ICD-10-CM

## 2015-01-08 DIAGNOSIS — J301 Allergic rhinitis due to pollen: Secondary | ICD-10-CM

## 2015-01-08 DIAGNOSIS — B9689 Other specified bacterial agents as the cause of diseases classified elsewhere: Secondary | ICD-10-CM

## 2015-01-08 MED ORDER — FLUTICASONE PROPIONATE 50 MCG/ACT NA SUSP: 2.0000 | Freq: Every day | NASAL | Status: DC

## 2015-01-08 MED ORDER — CEFPROZIL 500 MG PO TABS: 500.0000 mg | ORAL_TABLET | Freq: Two times a day (BID) | ORAL | Status: DC

## 2015-01-08 NOTE — Progress Notes (Signed)
   Subjective:    Patient ID: Tracy Spencer, female    DOB: 1951-08-03, 64 y.o.   MRN: [id number]  Cough This is a recurrent problem. Episode onset: february. Associated symptoms include rhinorrhea and wheezing. Pertinent negatives include no chest pain, ear pain, fever or shortness of breath. Associated symptoms comments: Loss of voice, headache. Treatments tried: zyrtec, lemons, cough drops, salt water.   Started last weds couldn't talk now worse felt hot today No severe SOB Patient not toxic not respiratory distress  Review of Systems  Constitutional: Negative for fever and activity change.  HENT: Positive for congestion and rhinorrhea. Negative for ear pain.   Eyes: Negative for discharge.  Respiratory: Positive for cough and wheezing. Negative for shortness of breath.   Cardiovascular: Negative for chest pain.   I believe that the patient does have moderate amount of allergy issues with this.    Objective:   Physical Exam  Constitutional: She appears well-developed.  HENT:  Head: Normocephalic.  Nose: Nose normal.  Mouth/Throat: Oropharynx is clear and moist. No oropharyngeal exudate.  Neck: Neck supple.  Cardiovascular: Normal rate and normal heart sounds.   No murmur heard. Pulmonary/Chest: Effort normal and breath sounds normal. She has no wheezes.  Lymphadenopathy:    She has no cervical adenopathy.  Skin: Skin is warm and dry.  Nursing note and vitals reviewed.         Assessment & Plan:  Severe sinusitis Viral syndrome Secondary sinusitis Antibodies prescribed Warning signs discussed Hollow up if ongoing troubles

## 2015-01-09 ENCOUNTER — Other Ambulatory Visit: Payer: Self-pay

## 2015-01-09 ENCOUNTER — Telehealth: Payer: Self-pay

## 2015-01-09 DIAGNOSIS — Z1211 Encounter for screening for malignant neoplasm of colon: Secondary | ICD-10-CM

## 2015-01-09 NOTE — Telephone Encounter (Signed)
Pt left Vm she is ready to schedule screening colonoscopy. She was on 10 year recall and received a letter to call.

## 2015-01-09 NOTE — Telephone Encounter (Signed)
Gastroenterology Pre-Procedure Review  Request Date:  01/09/2015 Requesting Physician: ON RECALL  ( LAST COLONOSCOPY DONE BY DR. Jena GaussOURK 02/07/2005)   PATIENT REVIEW QUESTIONS: The patient responded to the following health history questions as indicated:    Pt only drinks alcohol on special occasions  1. Diabetes Melitis: yes 2. Joint replacements in the past 12 months: no 3. Major health problems in the past 3 months: no 4. Has an artificial valve or MVP: no 5. Has a defibrillator: no 6. Has been advised in past to take antibiotics in advance of a procedure like teeth cleaning: no    MEDICATIONS & ALLERGIES:    Patient reports the following regarding taking any blood thinners:   Plavix? no Aspirin? no Coumadin? no  Patient confirms/reports the following medications:  Current Outpatient Prescriptions  Medication Sig Dispense Refill  . amLODipine (NORVASC) 5 MG tablet Take 1 tablet (5 mg total) by mouth daily. 90 tablet 1  . calcium carbonate (OS-CAL) 600 MG TABS tablet Take 600 mg by mouth 2 (two) times daily with a meal.    . cefPROZIL (CEFZIL) 500 MG tablet Take 1 tablet (500 mg total) by mouth 2 (two) times daily. 20 tablet 0  . cetirizine (ZYRTEC) 10 MG tablet Take 1 tablet (10 mg total) by mouth daily. 90 tablet 1  . fish oil-omega-3 fatty acids 1000 MG capsule Take 1 g by mouth daily. Take one bid    . fluticasone (FLONASE) 50 MCG/ACT nasal spray Place 2 sprays into both nostrils daily. 16 g 5  . glipiZIDE (GLUCOTROL) 5 MG tablet Take 0.5 tablets (2.5 mg total) by mouth 2 (two) times daily before a meal. 90 tablet 1  . losartan-hydrochlorothiazide (HYZAAR) 100-25 MG per tablet TAKE 1 TABLET BY MOUTH EVERY DAY 90 tablet 1  . metFORMIN (GLUCOPHAGE) 500 MG tablet TAKE 1 TABLET BY MOUTH TWICE DAILY WITH MEALS 180 tablet 1  . potassium chloride SA (K-DUR,KLOR-CON) 20 MEQ tablet TAKE 1 TABLET BY MOUTH TWICE DAILY 180 tablet 1  . rosuvastatin (CRESTOR) 40 MG tablet TAKE 1 TABLET BY  MOUTH EVERY DAY 90 tablet 1  . vitamin C (ASCORBIC ACID) 500 MG tablet Take 500 mg by mouth daily.    . ONE TOUCH ULTRA TEST test strip USE TO TEST BLOOD SUGAR EVERY DAY 100 each 5  . ONETOUCH DELICA LANCETS 33G MISC USE TO TEST BLOOD SUGAR EVERY DAY 100 each 1   No current facility-administered medications for this visit.    Patient confirms/reports the following allergies:  Allergies  Allergen Reactions  . Naproxen Other (See Comments)    Vomiting  . Lisinopril Cough    No orders of the defined types were placed in this encounter.    AUTHORIZATION INFORMATION Primary Insurance:  ID #:  Group #:  Pre-Cert / Auth required:  Pre-Cert / Auth #:   Secondary Insurance:  ID #:  Group #:  Pre-Cert / Auth required:  Pre-Cert / Auth #:  SCHEDULE INFORMATION: Procedure has been scheduled as follows:  Date: 02/07/2015               Time:  8:30 Am Location: [facility]  This Gastroenterology Pre-Precedure Review Form is being routed to the following provider(s): R. Roetta SessionsMichael Rourk, MD

## 2015-01-10 MED ORDER — PEG-KCL-NACL-NASULF-NA ASC-C 100 G PO SOLR: 1.0000 | ORAL | Status: DC

## 2015-01-10 NOTE — Telephone Encounter (Signed)
OK to schedule.  Day of bowel prep: glipizide Hold. Metformin 1/2 tablet twice a day.

## 2015-01-10 NOTE — Telephone Encounter (Signed)
Rx sent to the pharmacy and instructions mailed to pt.  

## 2015-01-18 ENCOUNTER — Other Ambulatory Visit: Payer: Self-pay | Admitting: Family Medicine

## 2015-01-19 ENCOUNTER — Other Ambulatory Visit: Payer: Self-pay | Admitting: *Deleted

## 2015-01-19 ENCOUNTER — Telehealth: Payer: Self-pay | Admitting: *Deleted

## 2015-01-19 NOTE — Telephone Encounter (Signed)
Scott from [facility]. Called to clarify pt's rx for amlodipine. Pharm has been filling 5mg  one half daily since oct. On march 11 got script for a 90 day supply for one daily. Pt picked that script up yesterday. Scott then received rx yesterday for one half daily. Pt in on 3/11 the day script was sent for one daily. No note in chart that states it was increased. [facility] to ask [facility] how she is taking med.

## 2015-01-19 NOTE — Telephone Encounter (Signed)
Discussed with pt. Pt has been taking one tablet daily since her office visit on 3/11. Dr. Lorin PicketScott did increase. Med list corrected. Scott at [facility] notified.

## 2015-01-24 NOTE — Telephone Encounter (Signed)
Pt called to review instructions on meds. I went over the diabetic meds instructions and told her it is OK to take BP pills with a sip of water on the morning of procedure.

## 2015-02-01 ENCOUNTER — Telehealth: Payer: Self-pay

## 2015-02-01 NOTE — Telephone Encounter (Signed)
I called [employer] @ [phone] and spoke to Karen O who said that a PA is not required for the screening colonoscopy.  

## 2015-02-07 ENCOUNTER — Ambulatory Visit (HOSPITAL_COMMUNITY)
Admission: RE | Admit: 2015-02-07 | Discharge: 2015-02-07 | Disposition: A | Discharge status: Home / Self Care | Payer: Medicare Other | Source: Ambulatory Visit | Attending: Internal Medicine | Admitting: Internal Medicine

## 2015-02-07 ENCOUNTER — Encounter (HOSPITAL_COMMUNITY): Payer: Self-pay

## 2015-02-07 ENCOUNTER — Encounter (HOSPITAL_COMMUNITY)
Admission: RE | Disposition: A | Discharge status: Home / Self Care | Payer: Self-pay | Source: Ambulatory Visit | Attending: Internal Medicine

## 2015-02-07 DIAGNOSIS — Z1211 Encounter for screening for malignant neoplasm of colon: Secondary | ICD-10-CM | POA: Diagnosis not present

## 2015-02-07 DIAGNOSIS — Z7951 Long term (current) use of inhaled steroids: Secondary | ICD-10-CM | POA: Insufficient documentation

## 2015-02-07 DIAGNOSIS — J309 Allergic rhinitis, unspecified: Secondary | ICD-10-CM | POA: Diagnosis not present

## 2015-02-07 DIAGNOSIS — I10 Essential (primary) hypertension: Secondary | ICD-10-CM | POA: Insufficient documentation

## 2015-02-07 DIAGNOSIS — K648 Other hemorrhoids: Secondary | ICD-10-CM | POA: Diagnosis not present

## 2015-02-07 DIAGNOSIS — Z79899 Other long term (current) drug therapy: Secondary | ICD-10-CM | POA: Insufficient documentation

## 2015-02-07 DIAGNOSIS — K5730 Diverticulosis of large intestine without perforation or abscess without bleeding: Secondary | ICD-10-CM | POA: Insufficient documentation

## 2015-02-07 HISTORY — PX: COLONOSCOPY: SHX5424

## 2015-02-07 LAB — GLUCOSE, CAPILLARY: GLUCOSE-CAPILLARY: 108 mg/dL — AB (ref 70–99)

## 2015-02-07 SURGERY — COLONOSCOPY
Anesthesia: Moderate Sedation

## 2015-02-07 MED ORDER — MEPERIDINE HCL 100 MG/ML IJ SOLN
INTRAMUSCULAR | Status: DC | PRN
  Administered 2015-02-07 (×2): 25 mg via INTRAVENOUS
  Administered 2015-02-07: 50 mg via INTRAVENOUS

## 2015-02-07 MED ORDER — ONDANSETRON HCL 4 MG/2ML IJ SOLN
INTRAMUSCULAR | Status: DC | PRN
  Administered 2015-02-07: 4 mg via INTRAVENOUS

## 2015-02-07 MED ORDER — MIDAZOLAM HCL 5 MG/5ML IJ SOLN
INTRAMUSCULAR | Status: DC | PRN
  Administered 2015-02-07 (×2): 2 mg via INTRAVENOUS
  Administered 2015-02-07: 1 mg via INTRAVENOUS

## 2015-02-07 MED ORDER — SODIUM CHLORIDE 0.9 % IV SOLN
INTRAVENOUS | Status: DC
  Administered 2015-02-07: 08:00:00 via INTRAVENOUS

## 2015-02-07 MED ORDER — MIDAZOLAM HCL 5 MG/5ML IJ SOLN
INTRAMUSCULAR | Status: AC
  Filled 2015-02-07: qty 10

## 2015-02-07 MED ORDER — ONDANSETRON HCL 4 MG/2ML IJ SOLN
INTRAMUSCULAR | Status: AC
  Filled 2015-02-07: qty 2

## 2015-02-07 MED ORDER — STERILE WATER FOR IRRIGATION IR SOLN
Status: DC | PRN
  Administered 2015-02-07: 08:00:00

## 2015-02-07 MED ORDER — MEPERIDINE HCL 100 MG/ML IJ SOLN
INTRAMUSCULAR | Status: AC
  Filled 2015-02-07: qty 2

## 2015-02-07 NOTE — Discharge Instructions (Signed)
Colonoscopy Discharge Instructions  Read the instructions outlined below and refer to this sheet in the next few weeks. These discharge instructions provide you with general information on caring for yourself after you leave the hospital. Your doctor may also give you specific instructions. While your treatment has been planned according to the most current medical practices available, unavoidable complications occasionally occur. If you have any problems or questions after discharge, call Dr. Jena Gaussourk at [phone]. ACTIVITY  You may resume your regular activity, but move at a slower pace for the next 24 hours.   Take frequent rest periods for the next 24 hours.   Walking will help get rid of the air and reduce the bloated feeling in your belly (abdomen).   No driving for 24 hours (because of the medicine (anesthesia) used during the test).    Do not sign any important legal documents or operate any machinery for 24 hours (because of the anesthesia used during the test).  NUTRITION  Drink plenty of fluids.   You may resume your normal diet as instructed by your doctor.   Begin with a light meal and progress to your normal diet. Heavy or fried foods are harder to digest and may make you feel sick to your stomach (nauseated).   Avoid alcoholic beverages for 24 hours or as instructed.  MEDICATIONS  You may resume your normal medications unless your doctor tells you otherwise.  WHAT YOU CAN EXPECT TODAY  Some feelings of bloating in the abdomen.   Passage of more gas than usual.   Spotting of blood in your stool or on the toilet paper.  IF YOU HAD POLYPS REMOVED DURING THE COLONOSCOPY:  No aspirin products for 7 days or as instructed.   No alcohol for 7 days or as instructed.   Eat a soft diet for the next 24 hours.  FINDING OUT THE RESULTS OF YOUR TEST Not all test results are available during your visit. If your test results are not back during the visit, make an appointment  with your caregiver to find out the results. Do not assume everything is normal if you have not heard from your caregiver or the medical facility. It is important for you to follow up on all of your test results.  SEEK IMMEDIATE MEDICAL ATTENTION IF:  You have more than a spotting of blood in your stool.   Your belly is swollen (abdominal distention).   You are nauseated or vomiting.   You have a temperature over 101.   You have abdominal pain or discomfort that is severe or gets worse throughout the day.    Diverticulosis information provided  Repeat colonoscopy for screening purposes in 10 years    Diverticulosis Diverticulosis is the condition that develops when small pouches (diverticula) form in the wall of your colon. Your colon, or large intestine, is where water is absorbed and stool is formed. The pouches form when the inside layer of your colon pushes through weak spots in the outer layers of your colon. CAUSES  No one knows exactly what causes diverticulosis. RISK FACTORS  Being older than 50. Your risk for this condition increases with age. Diverticulosis is rare in people younger than 40 years. By age 66, almost everyone has it.  Eating a low-fiber diet.  Being frequently constipated.  Being overweight.  Not getting enough exercise.  Smoking.  Taking over-the-counter pain medicines, like aspirin and ibuprofen. SYMPTOMS  Most people with diverticulosis do not have symptoms. DIAGNOSIS  Because diverticulosis  often has no symptoms, health care providers often discover the condition during an exam for other colon problems. In many cases, a health care provider will diagnose diverticulosis while using a flexible scope to examine the colon (colonoscopy). TREATMENT  If you have never developed an infection related to diverticulosis, you may not need treatment. If you have had an infection before, treatment may include:  Eating more fruits, vegetables, and  grains.  Taking a fiber supplement.  Taking a live bacteria supplement (probiotic).  Taking medicine to relax your colon. HOME CARE INSTRUCTIONS   Drink at least 6-8 glasses of water each day to prevent constipation.  Try not to strain when you have a bowel movement.  Keep all follow-up appointments. If you have had an infection before:  Increase the fiber in your diet as directed by your health care provider or dietitian.  Take a dietary fiber supplement if your health care provider approves.  Only take medicines as directed by your health care provider. SEEK MEDICAL CARE IF:   You have abdominal pain.  You have bloating.  You have cramps.  You have not gone to the bathroom in 3 days. SEEK IMMEDIATE MEDICAL CARE IF:   Your pain gets worse.  Yourbloating becomes very bad.  You have a fever or chills, and your symptoms suddenly get worse.  You begin vomiting.  You have bowel movements that are bloody or black. MAKE SURE YOU:  Understand these instructions.  Will watch your condition.  Will get help right away if you are not doing well or get worse.

## 2015-02-07 NOTE — H&P (Signed)
_0 @   Primary Care Physician:  Sallee Lange, MD Primary Gastroenterologist:  Dr. Gala Romney  Pre-Procedure History & Physical: HPI:  Tracy Spencer is a 64 y.o. female is here for a screening colonoscopy.  No bowel symptoms. No family history of colon cancer. Reported negative colonoscopy 10 years ago.  Past Medical History  Diagnosis Date  . Hypertension   . Eczema   . Prediabetes   . Allergic rhinitis     Past Surgical History  Procedure Laterality Date  . Tubal ligation    . Abdominal hysterectomy    . Colonoscopy      Prior to Admission medications   Medication Sig Start Date End Date Taking? Authorizing Provider  amLODipine (NORVASC) 5 MG tablet Take 1 tablet (5 mg total) by mouth daily. 12/22/14  Yes Kathyrn Drown, MD  calcium carbonate (OS-CAL) 600 MG TABS tablet Take 600 mg by mouth 2 (two) times daily with a meal.   Yes Historical Provider, MD  cetirizine (ZYRTEC) 10 MG tablet Take 1 tablet (10 mg total) by mouth daily. 07/18/14  Yes Kathyrn Drown, MD  fish oil-omega-3 fatty acids 1000 MG capsule Take 1 g by mouth daily. Take one bid   Yes Historical Provider, MD  fluticasone (FLONASE) 50 MCG/ACT nasal spray Place 2 sprays into both nostrils daily. 01/08/15  Yes Kathyrn Drown, MD  glipiZIDE (GLUCOTROL) 5 MG tablet Take 0.5 tablets (2.5 mg total) by mouth 2 (two) times daily before a meal. 12/22/14  Yes Kathyrn Drown, MD  losartan-hydrochlorothiazide (HYZAAR) 100-25 MG per tablet TAKE 1 TABLET BY MOUTH EVERY DAY 12/22/14  Yes Kathyrn Drown, MD  metFORMIN (GLUCOPHAGE) 500 MG tablet TAKE 1 TABLET BY MOUTH TWICE DAILY WITH MEALS 12/22/14  Yes Kathyrn Drown, MD  ONE TOUCH ULTRA TEST test strip USE TO TEST BLOOD SUGAR EVERY DAY 11/25/14  Yes Kathyrn Drown, MD  ONETOUCH DELICA LANCETS 93X MISC USE TO TEST BLOOD SUGAR EVERY DAY 11/09/14  Yes Kathyrn Drown, MD  peg 3350 powder (MOVIPREP) 100 G SOLR Take 1 kit (200 g total) by mouth as directed. 01/10/15  Yes Daneil Dolin, MD   potassium chloride SA (K-DUR,KLOR-CON) 20 MEQ tablet TAKE 1 TABLET BY MOUTH TWICE DAILY 12/22/14  Yes Kathyrn Drown, MD  rosuvastatin (CRESTOR) 40 MG tablet TAKE 1 TABLET BY MOUTH EVERY DAY 12/22/14  Yes Kathyrn Drown, MD  vitamin C (ASCORBIC ACID) 500 MG tablet Take 500 mg by mouth daily.   Yes Historical Provider, MD  cefPROZIL (CEFZIL) 500 MG tablet Take 1 tablet (500 mg total) by mouth 2 (two) times daily. Patient not taking: Reported on 01/24/2015 01/08/15   Kathyrn Drown, MD  glipiZIDE (GLUCOTROL) 5 MG tablet TAKE ONE HALF TABLET BY MOUTH TWICE DAILY BEFORE A MEAL Patient not taking: Reported on 01/24/2015 01/18/15   Kathyrn Drown, MD  metFORMIN (GLUCOPHAGE) 500 MG tablet TAKE 1 TABLET BY MOUTH TWICE DAILY WITH MEALS Patient not taking: Reported on 01/24/2015 01/18/15   Kathyrn Drown, MD  potassium chloride SA (K-DUR,KLOR-CON) 20 MEQ tablet TAKE 1 TABLET BY MOUTH TWICE DAILY Patient not taking: Reported on 01/24/2015 01/18/15   Kathyrn Drown, MD    Allergies as of 01/09/2015 - Review Complete 01/09/2015  Allergen Reaction Noted  . Naproxen Other (See Comments) 01/28/2013  . Lisinopril Cough 01/28/2013    Family History  Problem Relation Age of Onset  . Hypertension Mother   . Hypertension Father  History   Social History  . Marital Status: Married    Spouse Name: N/A  . Number of Children: N/A  . Years of Education: N/A   Occupational History  . Not on file.   Social History Main Topics  . Smoking status: Never Smoker   . Smokeless tobacco: Not on file  . Alcohol Use: Not on file  . Drug Use: Not on file  . Sexual Activity: Not on file   Other Topics Concern  . Not on file   Social History Narrative    Review of Systems: See HPI, otherwise negative ROS  Physical Exam: BP 132/72 mmHg  Pulse 57  Temp(Src) 97.8 F (36.6 C) (Oral)  Resp 22  Ht _0  (1.651 m)  Wt 190 lb (86.183 kg)  BMI 31.62 kg/m2  SpO2 99% General:   Alert,  Well-developed,  well-nourished, pleasant and cooperative in NAD Head:  Normocephalic and atraumatic. Eyes:  Sclera clear, no icterus.   Conjunctiva pink. Ears:  Normal auditory acuity. Nose:  No deformity, discharge,  or lesions. Mouth:  No deformity or lesions, dentition normal. Neck:  Supple; no masses or thyromegaly. Lungs:  Clear throughout to auscultation.   No wheezes, crackles, or rhonchi. No acute distress. Heart:  Regular rate and rhythm; no murmurs, clicks, rubs,  or gallops. Abdomen:  Soft, nontender and nondistended. No masses, hepatosplenomegaly or hernias noted. Normal bowel sounds, without guarding, and without rebound.   Msk:  Symmetrical without gross deformities. Normal posture. Pulses:  Normal pulses noted. Extremities:  Without clubbing or edema. Neurologic:  Alert and  oriented x4;  grossly normal neurologically. Skin:  Intact without significant lesions or rashes. Cervical Nodes:  No significant cervical adenopathy. Psych:  Alert and cooperative. Normal mood and affect.  Impression/Plan: Tracy Spencer is now here to undergo a screening colonoscopy.  Average risk screening examination.  Risks, benefits, limitations, imponderables and alternatives regarding colonoscopy have been reviewed with the patient. Questions have been answered. All parties agreeable.     Notice:  This dictation was prepared with Dragon dictation along with smaller phrase technology. Any transcriptional errors that result from this process are unintentional and may not be corrected upon review.

## 2015-02-07 NOTE — Op Note (Signed)
[facility] [address] [place], [id number]   COLONOSCOPY PROCEDURE REPORT  PATIENT: Tracy Spencer, Tracy Spencer  MR#: [id number] BIRTHDATE: November 03, 1950 , 63  yrs. old GENDER: female ENDOSCOPIST: R.  Roetta SessionsMichael Brinklee Cisse, MD FACP [facility] REFERRED AO:ZHYQMBY:Scott Gerda DissLuking, M.D. PROCEDURE DATE:  02/07/2015 PROCEDURE:   Colonoscopy, screening INDICATIONS:Average risk colorectal cancer screening examination. MEDICATIONS: Versed 6 mg IV and Demerol 100 mg IV in divided doses. Zofran 4 mg IV. ASA CLASS:       Class II  CONSENT: The risks, benefits, alternatives and imponderables including but not limited to bleeding, perforation as well as the possibility of a missed lesion have been reviewed.  The potential for biopsy, lesion removal, etc. have also been discussed. Questions have been answered.  All parties agreeable.  Please see the history and physical in the medical record for more information.  DESCRIPTION OF PROCEDURE:   After the risks benefits and alternatives of the procedure were thoroughly explained, informed consent was obtained.  The digital rectal exam revealed no abnormalities of the rectum.   The EC-[id number] ([id number]  endoscope was introduced through the anus and advanced to the terminal ileum which was intubated for a short distance. No adverse events experienced.   The quality of the prep was     The instrument was then slowly withdrawn as the colon was fully examined.      COLON FINDINGS: Minimal internal hemorrhoids; otherwise normal appearing rectal mucosa.  Scattered left-sided diverticula; the remainder of the colonic mucosa appeared normal.  The distal 10 cm of terminal ileum mucosa also appeared normal.  Retroflexion was performed. .  Withdrawal time=14 minutes 0 seconds.  The scope was withdrawn and the procedure completed. COMPLICATIONS: There were no immediate complications.  ENDOSCOPIC IMPRESSION: Colonic diverticulosis  RECOMMENDATIONS: Repeat  colonoscopy for screening purposes in 10 years.  eSigned:  R. Roetta SessionsMichael Carlyne Keehan, MD Jerrel IvoryFACP [facility] 02/07/2015 9:32 AM   cc:  CPT CODES: ICD CODES:  The ICD and CPT codes recommended by this software are interpretations from the data that the clinical staff has captured with the software.  The verification of the translation of this report to the ICD and CPT codes and modifiers is the sole responsibility of the health care institution and practicing physician where this report was generated.  [employer] will not be held responsible for the validity of the ICD and CPT codes included on this report.  AMA assumes no liability for data contained or not contained herein. CPT is a Publishing rights managerregistered trademark of the [employer].  PATIENT NAME:  Tracy Spencer, Tracy Spencer MR#: [id number]

## 2015-02-08 ENCOUNTER — Encounter (HOSPITAL_COMMUNITY): Payer: Self-pay | Admitting: Internal Medicine

## 2015-02-19 LAB — HM DIABETES EYE EXAM

## 2015-03-02 ENCOUNTER — Encounter: Payer: Self-pay | Admitting: *Deleted

## 2015-03-15 ENCOUNTER — Other Ambulatory Visit: Payer: Self-pay | Admitting: Family Medicine

## 2015-03-19 ENCOUNTER — Encounter: Payer: Self-pay | Admitting: Family Medicine

## 2015-03-19 ENCOUNTER — Ambulatory Visit (INDEPENDENT_AMBULATORY_CARE_PROVIDER_SITE_OTHER): Payer: Medicare Other | Admitting: Family Medicine

## 2015-03-19 VITALS — BP 132/80 | Temp 98.3°F | Ht 66.0 in | Wt 186.2 lb

## 2015-03-19 DIAGNOSIS — E785 Hyperlipidemia, unspecified: Secondary | ICD-10-CM

## 2015-03-19 DIAGNOSIS — E119 Type 2 diabetes mellitus without complications: Secondary | ICD-10-CM | POA: Diagnosis not present

## 2015-03-19 DIAGNOSIS — Z79899 Other long term (current) drug therapy: Secondary | ICD-10-CM

## 2015-03-19 DIAGNOSIS — G5792 Unspecified mononeuropathy of left lower limb: Secondary | ICD-10-CM

## 2015-03-19 DIAGNOSIS — I10 Essential (primary) hypertension: Secondary | ICD-10-CM | POA: Diagnosis not present

## 2015-03-19 DIAGNOSIS — M792 Neuralgia and neuritis, unspecified: Secondary | ICD-10-CM

## 2015-03-19 NOTE — Patient Instructions (Addendum)
For your leg may do Tylenol 500 mg   May take 1 or 2 twice a day  I believe you have a pinched nerve in your back  Options -watch it -prescription meds for the symptoms -see a specialist  See you in Sept for a follow up  If worse before then please call

## 2015-03-19 NOTE — Progress Notes (Signed)
   Subjective:    Patient ID: Tracy Spencer, female    DOB: 07-26-1951, 64 y.o.   MRN: [id number]  Leg Pain  The incident occurred more than 1 week ago. The incident occurred at home. There was no injury mechanism. The pain is present in the left leg. Quality: tingling. The pain is mild. The pain has been constant since onset. She reports no foreign bodies present. Nothing aggravates the symptoms. She has tried acetaminophen for the symptoms. The treatment provided no relief.   Patient has no other concerns at this time.  No weakness Not on the right leg at all No injury No back pain  Review of Systems See above    Objective:   Physical Exam Lungs clear heart regular negative straight leg raise strength in both legs very good reflexes good subjective tingling into the left lateral leg into the thigh and calf region       Assessment & Plan:  Neuralgia-I feel that this patient will get better no need for intervention currently recommend watchful waiting. I did discuss medications with the patient as well as neurology referral she would not one to do this currently she will let us know if it gets worse otherwise she will follow-up in the September

## 2015-06-14 LAB — BASIC METABOLIC PANEL
BUN / CREAT RATIO: 14 (ref 11–26)
BUN: 12 mg/dL (ref 8–27)
CO2: 26 mmol/L (ref 18–29)
CREATININE: 0.88 mg/dL (ref 0.57–1.00)
Calcium: 10 mg/dL (ref 8.7–10.3)
Chloride: 99 mmol/L (ref 97–108)
GFR, EST AFRICAN AMERICAN: 79 mL/min/{1.73_m2} (ref >59)
GFR, EST NON AFRICAN AMERICAN: 69 mL/min/{1.73_m2} (ref >59)
Glucose: 105 mg/dL — ABNORMAL HIGH (ref 65–99)
POTASSIUM: 3.6 mmol/L (ref 3.5–5.2)
SODIUM: 139 mmol/L (ref 134–144)

## 2015-06-14 LAB — LIPID PANEL
CHOL/HDL RATIO: 2.6 ratio (ref 0.0–4.4)
Cholesterol, Total: 187 mg/dL (ref 100–199)
HDL: 71 mg/dL (ref >39)
LDL Calculated: 103 mg/dL — ABNORMAL HIGH (ref 0–99)
TRIGLYCERIDES: 67 mg/dL (ref 0–149)
VLDL Cholesterol Cal: 13 mg/dL (ref 5–40)

## 2015-06-14 LAB — HEPATIC FUNCTION PANEL
ALT: 15 IU/L (ref 0–32)
AST: 20 IU/L (ref 0–40)
Albumin: 4.6 g/dL (ref 3.6–4.8)
Alkaline Phosphatase: 44 IU/L (ref 39–117)
BILIRUBIN TOTAL: 0.4 mg/dL (ref 0.0–1.2)
BILIRUBIN, DIRECT: 0.1 mg/dL (ref 0.00–0.40)
Total Protein: 7.5 g/dL (ref 6.0–8.5)

## 2015-06-14 LAB — HEMOGLOBIN A1C
Est. average glucose Bld gHb Est-mCnc: 128 mg/dL
Hgb A1c MFr Bld: 6.1 % — ABNORMAL HIGH (ref 4.8–5.6)

## 2015-06-20 ENCOUNTER — Other Ambulatory Visit: Payer: Self-pay | Admitting: Family Medicine

## 2015-06-20 ENCOUNTER — Ambulatory Visit (INDEPENDENT_AMBULATORY_CARE_PROVIDER_SITE_OTHER): Payer: Medicare Other | Admitting: Family Medicine

## 2015-06-20 ENCOUNTER — Encounter: Payer: Self-pay | Admitting: Family Medicine

## 2015-06-20 VITALS — BP 140/86 | Ht 66.0 in | Wt 187.8 lb

## 2015-06-20 DIAGNOSIS — E119 Type 2 diabetes mellitus without complications: Secondary | ICD-10-CM | POA: Diagnosis not present

## 2015-06-20 DIAGNOSIS — I10 Essential (primary) hypertension: Secondary | ICD-10-CM

## 2015-06-20 DIAGNOSIS — Z23 Encounter for immunization: Secondary | ICD-10-CM | POA: Diagnosis not present

## 2015-06-20 DIAGNOSIS — Z1231 Encounter for screening mammogram for malignant neoplasm of breast: Secondary | ICD-10-CM

## 2015-06-20 DIAGNOSIS — E785 Hyperlipidemia, unspecified: Secondary | ICD-10-CM | POA: Diagnosis not present

## 2015-06-20 MED ORDER — METFORMIN HCL 500 MG PO TABS: 500.0000 mg | ORAL_TABLET | Freq: Two times a day (BID) | ORAL | Status: DC

## 2015-06-20 MED ORDER — ROSUVASTATIN CALCIUM 40 MG PO TABS: 40.0000 mg | ORAL_TABLET | Freq: Every day | ORAL | Status: DC

## 2015-06-20 MED ORDER — GLIPIZIDE 5 MG PO TABS: ORAL_TABLET | ORAL | Status: DC

## 2015-06-20 MED ORDER — AMLODIPINE BESYLATE 5 MG PO TABS: 5.0000 mg | ORAL_TABLET | Freq: Every day | ORAL | Status: DC

## 2015-06-20 MED ORDER — LOSARTAN POTASSIUM-HCTZ 100-25 MG PO TABS: ORAL_TABLET | ORAL | Status: DC

## 2015-06-20 MED ORDER — POTASSIUM CHLORIDE CRYS ER 20 MEQ PO TBCR: 20.0000 meq | EXTENDED_RELEASE_TABLET | Freq: Two times a day (BID) | ORAL | Status: DC

## 2015-06-20 NOTE — Progress Notes (Signed)
   Subjective:    Patient ID: Tracy Spencer, female    DOB: 12-31-1950, 64 y.o.   MRN: [id number]  Diabetes She presents for her follow-up diabetic visit. She has type 2 diabetes mellitus. Pertinent negatives for hypoglycemia include no confusion. Pertinent negatives for diabetes include no chest pain, no fatigue, no polydipsia, no polyphagia and no weakness. Risk factors for coronary artery disease include diabetes mellitus, dyslipidemia, hypertension and post-menopausal. Her weight is stable. She is following a diabetic diet. She sees a podiatrist.Eye exam is current.  Hypertension This is a chronic problem. The current episode started more than 1 year ago. The problem has been gradually improving since onset. Pertinent negatives include no chest pain. Risk factors for coronary artery disease include diabetes mellitus and dyslipidemia.  Hyperlipidemia This is a chronic problem. The current episode started more than 1 year ago. The problem is controlled. Recent lipid tests were reviewed and are normal. Pertinent negatives include no chest pain.    Patient would ike to review recent blood work-no concerns  Review of Systems  Constitutional: Negative for activity change, appetite change and fatigue.  HENT: Negative for congestion.   Respiratory: Negative for cough.   Cardiovascular: Negative for chest pain.  Gastrointestinal: Negative for abdominal pain.  Endocrine: Negative for polydipsia and polyphagia.  Neurological: Negative for weakness.  Psychiatric/Behavioral: Negative for confusion.       Objective:   Physical Exam  Constitutional: She appears well-nourished. No distress.  Cardiovascular: Normal rate, regular rhythm and normal heart sounds.   No murmur heard. Pulmonary/Chest: Effort normal and breath sounds normal. No respiratory distress.  Musculoskeletal: She exhibits no edema.  Lymphadenopathy:    She has no cervical adenopathy.  Neurological: She is alert. She exhibits  normal muscle tone.  Psychiatric: Her behavior is normal.  Vitals reviewed.    Patient overall is doing well. Multiple problems recovered questions answered very Spent in discussion, 25 minutes spent with patient     Assessment & Plan:  1. Type 2 diabetes mellitus without complication Diabetes good control continue current measures watch diet closely. Stay physically active.  2. Essential hypertension, benign Blood pressure under good control continue current measures taking medication. Watch diet avoid excessive salt  3. Hyperlipemia Cholesterol has gone up slightly improved the diet increase exercise continue medication recheck this again in 6 months  4. Encounter for immunization today

## 2015-06-20 NOTE — Patient Instructions (Addendum)
You should take Vit D daily 800 units to 2,000 units daily  Call [phone] to schedule your mamogram     Diabetes Mellitus and Food It is important for you to manage your blood sugar (glucose) level. Your blood glucose level can be greatly affected by what you eat. Eating healthier foods in the appropriate amounts throughout the day at about the same time each day will help you control your blood glucose level. It can also help slow or prevent worsening of your diabetes mellitus. Healthy eating may even help you improve the level of your blood pressure and reach or maintain a healthy weight.  HOW CAN FOOD AFFECT ME? Carbohydrates Carbohydrates affect your blood glucose level more than any other type of food. Your dietitian will help you determine how many carbohydrates to eat at each meal and teach you how to count carbohydrates. Counting carbohydrates is important to keep your blood glucose at a healthy level, especially if you are using insulin or taking certain medicines for diabetes mellitus. Alcohol Alcohol can cause sudden decreases in blood glucose (hypoglycemia), especially if you use insulin or take certain medicines for diabetes mellitus. Hypoglycemia can be a life-threatening condition. Symptoms of hypoglycemia (sleepiness, dizziness, and disorientation) are similar to symptoms of having too much alcohol.  If your health care provider has given you approval to drink alcohol, do so in moderation and use the following guidelines:  Women should not have more than one drink per day, and men should not have more than two drinks per day. One drink is equal to:  12 oz of beer.  5 oz of wine.  1 oz of hard liquor.  Do not drink on an empty stomach.  Keep yourself hydrated. Have water, diet soda, or unsweetened iced tea.  Regular soda, juice, and other mixers might contain a lot of carbohydrates and should be counted. WHAT FOODS ARE NOT RECOMMENDED? As you make food choices, it  is important to remember that all foods are not the same. Some foods have fewer nutrients per serving than other foods, even though they might have the same number of calories or carbohydrates. It is difficult to get your body what it needs when you eat foods with fewer nutrients. Examples of foods that you should avoid that are high in calories and carbohydrates but low in nutrients include:  Trans fats (most processed foods list trans fats on the Nutrition Facts label).  Regular soda.  Juice.  Candy.  Sweets, such as cake, pie, doughnuts, and cookies.  Fried foods. WHAT FOODS CAN I EAT? Have nutrient-rich foods, which will nourish your body and keep you healthy. The food you should eat also will depend on several factors, including:  The calories you need.  The medicines you take.  Your weight.  Your blood glucose level.  Your blood pressure level.  Your cholesterol level. You also should eat a variety of foods, including:  Protein, such as meat, poultry, fish, tofu, nuts, and seeds (lean animal proteins are best).  Fruits.  Vegetables.  Dairy products, such as milk, cheese, and yogurt (low fat is best).  Breads, grains, pasta, cereal, rice, and beans.  Fats such as olive oil, trans fat-free margarine, canola oil, avocado, and olives. DOES EVERYONE WITH DIABETES MELLITUS HAVE THE SAME MEAL PLAN? Because every person with diabetes mellitus is different, there is not one meal plan that works for everyone. It is very important that you meet with a dietitian who will help you create a meal  plan that is just right for you. Document Released: 06/26/2005 Document Revised: 10/04/2013 Document Reviewed: 08/26/2013 [facility] Patient Information 2015 [place]. This information is not intended to replace advice given to you by your health care provider. Make sure you discuss any questions you have with your health care provider.

## 2015-06-22 ENCOUNTER — Ambulatory Visit (HOSPITAL_COMMUNITY)
Admission: RE | Admit: 2015-06-22 | Discharge: 2015-06-22 | Disposition: A | Discharge status: Home / Self Care | Payer: Medicare Other | Source: Ambulatory Visit | Attending: Family Medicine | Admitting: Family Medicine

## 2015-06-22 DIAGNOSIS — Z1231 Encounter for screening mammogram for malignant neoplasm of breast: Secondary | ICD-10-CM | POA: Insufficient documentation

## 2015-10-01 ENCOUNTER — Ambulatory Visit (INDEPENDENT_AMBULATORY_CARE_PROVIDER_SITE_OTHER): Payer: Medicare Other | Admitting: Family Medicine

## 2015-10-01 ENCOUNTER — Encounter: Payer: Self-pay | Admitting: Family Medicine

## 2015-10-01 VITALS — BP 112/70 | Temp 97.8°F | Ht 66.0 in | Wt 183.4 lb

## 2015-10-01 DIAGNOSIS — J019 Acute sinusitis, unspecified: Secondary | ICD-10-CM | POA: Diagnosis not present

## 2015-10-01 DIAGNOSIS — B9689 Other specified bacterial agents as the cause of diseases classified elsewhere: Secondary | ICD-10-CM

## 2015-10-01 MED ORDER — AMOXICILLIN 500 MG PO TABS: 500.0000 mg | ORAL_TABLET | Freq: Three times a day (TID) | ORAL | Status: DC

## 2015-10-01 NOTE — Progress Notes (Signed)
   Subjective:    Patient ID: Tracy Spencer, female    DOB: 1950/11/06, 64 y.o.   MRN: [id number]  Cough This is a new problem. The current episode started 1 to 4 weeks ago. The problem occurs every few minutes. The cough is productive of sputum. Associated symptoms include rhinorrhea. Pertinent negatives include no chest pain, ear pain, fever, shortness of breath or wheezing.  PMH benign Patient states no other concerns this visit.  Review of Systems  Constitutional: Negative for fever and activity change.  HENT: Positive for congestion and rhinorrhea. Negative for ear pain.   Eyes: Negative for discharge.  Respiratory: Positive for cough. Negative for shortness of breath and wheezing.   Cardiovascular: Negative for chest pain.       Objective:   Physical Exam  Constitutional: She appears well-developed.  HENT:  Head: Normocephalic.  Nose: Nose normal.  Mouth/Throat: Oropharynx is clear and moist. No oropharyngeal exudate.  Neck: Neck supple.  Cardiovascular: Normal rate and normal heart sounds.   No murmur heard. Pulmonary/Chest: Effort normal and breath sounds normal. She has no wheezes.  Lymphadenopathy:    She has no cervical adenopathy.  Skin: Skin is warm and dry.  Nursing note and vitals reviewed.    Patient not toxic lungs sound clear I believe cough is coming from postnasal drainage no need for x-rays or lab work unless symptoms worsen     Assessment & Plan:  Patient was seen today for upper respiratory illness. It is felt that the patient is dealing with sinusitis. Antibiotics were prescribed today. Importance of compliance with medication was discussed. Symptoms should gradually resolve over the course of the next several days. If high fevers, progressive illness, difficulty breathing, worsening condition or failure for symptoms to improve over the next several days then the patient is to follow-up. If any emergent conditions the patient is to follow-up in the  emergency department otherwise to follow-up in the office.

## 2015-10-22 ENCOUNTER — Other Ambulatory Visit: Payer: Self-pay | Admitting: *Deleted

## 2015-10-22 MED ORDER — ROSUVASTATIN CALCIUM 40 MG PO TABS: 40.0000 mg | ORAL_TABLET | Freq: Every day | ORAL | Status: DC

## 2015-11-26 ENCOUNTER — Telehealth: Payer: Self-pay | Admitting: Family Medicine

## 2015-11-26 DIAGNOSIS — E119 Type 2 diabetes mellitus without complications: Secondary | ICD-10-CM

## 2015-11-26 DIAGNOSIS — E785 Hyperlipidemia, unspecified: Secondary | ICD-10-CM

## 2015-11-26 DIAGNOSIS — Z79899 Other long term (current) drug therapy: Secondary | ICD-10-CM

## 2015-11-26 DIAGNOSIS — I10 Essential (primary) hypertension: Secondary | ICD-10-CM

## 2015-11-26 NOTE — Telephone Encounter (Signed)
Pt is requesting lab orders to be sent over for an upcoming appt.  Last labs per epic were: lipid,hepatic,a1c,and bmp on 06/13/15.

## 2015-11-26 NOTE — Telephone Encounter (Signed)
Orders ready. Pt notified.  

## 2015-11-26 NOTE — Telephone Encounter (Signed)
Lipid, liver, metabolic 7, hemoglobin A1c, urine ACR 

## 2015-11-27 DIAGNOSIS — I10 Essential (primary) hypertension: Secondary | ICD-10-CM | POA: Diagnosis not present

## 2015-11-27 DIAGNOSIS — Z79899 Other long term (current) drug therapy: Secondary | ICD-10-CM | POA: Diagnosis not present

## 2015-11-27 DIAGNOSIS — L603 Nail dystrophy: Secondary | ICD-10-CM | POA: Diagnosis not present

## 2015-11-27 DIAGNOSIS — E119 Type 2 diabetes mellitus without complications: Secondary | ICD-10-CM | POA: Diagnosis not present

## 2015-11-27 DIAGNOSIS — L851 Acquired keratosis [keratoderma] palmaris et plantaris: Secondary | ICD-10-CM | POA: Diagnosis not present

## 2015-11-27 DIAGNOSIS — E785 Hyperlipidemia, unspecified: Secondary | ICD-10-CM | POA: Diagnosis not present

## 2015-11-28 ENCOUNTER — Encounter: Payer: Self-pay | Admitting: Family Medicine

## 2015-11-28 LAB — LIPID PANEL
Chol/HDL Ratio: 2.6 ratio units (ref 0.0–4.4)
Cholesterol, Total: 186 mg/dL (ref 100–199)
HDL: 72 mg/dL (ref >39)
LDL CALC: 99 mg/dL (ref 0–99)
Triglycerides: 73 mg/dL (ref 0–149)
VLDL CHOLESTEROL CAL: 15 mg/dL (ref 5–40)

## 2015-11-28 LAB — BASIC METABOLIC PANEL
BUN/Creatinine Ratio: 11 (ref 11–26)
BUN: 11 mg/dL (ref 8–27)
CALCIUM: 10 mg/dL (ref 8.7–10.3)
CHLORIDE: 99 mmol/L (ref 96–106)
CO2: 24 mmol/L (ref 18–29)
Creatinine, Ser: 1.01 mg/dL — ABNORMAL HIGH (ref 0.57–1.00)
GFR calc Af Amer: 67 mL/min/{1.73_m2} (ref >59)
GFR calc non Af Amer: 58 mL/min/{1.73_m2} — ABNORMAL LOW (ref >59)
Glucose: 99 mg/dL (ref 65–99)
Potassium: 3.9 mmol/L (ref 3.5–5.2)
Sodium: 140 mmol/L (ref 134–144)

## 2015-11-28 LAB — HEPATIC FUNCTION PANEL
ALT: 8 IU/L (ref 0–32)
AST: 17 IU/L (ref 0–40)
Albumin: 4.8 g/dL (ref 3.6–4.8)
Alkaline Phosphatase: 55 IU/L (ref 39–117)
Bilirubin Total: 0.3 mg/dL (ref 0.0–1.2)
Bilirubin, Direct: 0.11 mg/dL (ref 0.00–0.40)
Total Protein: 7.5 g/dL (ref 6.0–8.5)

## 2015-11-28 LAB — MICROALBUMIN / CREATININE URINE RATIO
CREATININE, UR: 160.6 mg/dL
MICROALB/CREAT RATIO: 6 mg/g creat (ref 0.0–30.0)
MICROALBUM., U, RANDOM: 9.7 ug/mL

## 2015-11-28 LAB — HEMOGLOBIN A1C
Est. average glucose Bld gHb Est-mCnc: 126 mg/dL
Hgb A1c MFr Bld: 6 % — ABNORMAL HIGH (ref 4.8–5.6)

## 2015-12-19 ENCOUNTER — Encounter: Payer: Self-pay | Admitting: Family Medicine

## 2015-12-19 ENCOUNTER — Ambulatory Visit (INDEPENDENT_AMBULATORY_CARE_PROVIDER_SITE_OTHER): Payer: Medicare HMO | Admitting: Family Medicine

## 2015-12-19 VITALS — BP 110/70 | Ht 64.5 in | Wt 186.2 lb

## 2015-12-19 DIAGNOSIS — I10 Essential (primary) hypertension: Secondary | ICD-10-CM | POA: Diagnosis not present

## 2015-12-19 DIAGNOSIS — E119 Type 2 diabetes mellitus without complications: Secondary | ICD-10-CM

## 2015-12-19 DIAGNOSIS — Z0000 Encounter for general adult medical examination without abnormal findings: Secondary | ICD-10-CM

## 2015-12-19 DIAGNOSIS — Z23 Encounter for immunization: Secondary | ICD-10-CM | POA: Diagnosis not present

## 2015-12-19 DIAGNOSIS — E785 Hyperlipidemia, unspecified: Secondary | ICD-10-CM | POA: Diagnosis not present

## 2015-12-19 NOTE — Patient Instructions (Signed)
Aspirin and Your Heart  Aspirin is a medicine that affects the way blood clots. Aspirin can be used to help reduce the risk of blood clots, heart attacks, and other heart-related problems.  SHOULD I TAKE ASPIRIN? Your health care provider will help you determine whether it is safe and beneficial for you to take aspirin daily. Taking aspirin daily may be beneficial if you:  Have had a heart attack or chest pain.  Have undergone open heart surgery such as coronary artery bypass surgery (CABG).  Have had coronary angioplasty.  Have experienced a stroke or transient ischemic attack (TIA).  Have peripheral vascular disease (PVD).  Have chronic heart rhythm problems such as atrial fibrillation. ARE THERE ANY RISKS OF TAKING ASPIRIN DAILY? Daily use of aspirin can increase your risk of side effects. Some of these include:  Bleeding. Bleeding problems can be minor or serious. An example of a minor problem is a cut that does not stop bleeding. An example of a more serious problem is stomach bleeding or bleeding into the brain. Your risk of bleeding is increased if you are also taking non-steroidal anti-inflammatory medicine (NSAIDs).  Increased bruising.  Upset stomach.  An allergic reaction. People who have nasal polyps have an increased risk of developing an aspirin allergy. WHAT ARE SOME GUIDELINES I SHOULD FOLLOW WHEN TAKING ASPIRIN?   Take aspirin only as directed by your health care provider. Make sure you understand how much you should take and what form you should take. The two forms of aspirin are:  Non-enteric-coated. This type of aspirin does not have a coating and is absorbed quickly. Non-enteric-coated aspirin is usually recommended for people with chest pain. This type of aspirin also comes in a chewable form.  Enteric-coated. This type of aspirin has a special coating that releases the medicine very slowly. Enteric-coated aspirin causes less stomach upset than non-enteric-coated  aspirin. This type of aspirin should not be chewed or crushed.  Drink alcohol in moderation. Drinking alcohol increases your risk of bleeding. WHEN SHOULD I SEEK MEDICAL CARE?   You have unusual bleeding or bruising.  You have stomach pain.  You have an allergic reaction. Symptoms of an allergic reaction include:  Hives.  Itchy skin.  Swelling of the lips, tongue, or face.  You have ringing in your ears. WHEN SHOULD I SEEK IMMEDIATE MEDICAL CARE?   Your bowel movements are bloody, dark red, or black in color.  You vomit or cough up blood.  You have blood in your urine.  You cough, wheeze, or feel short of breath. If you have any of the following symptoms, this is an emergency. Do not wait to see if the pain will go away. Get medical help at once. Call your local emergency services (911 in the [place] Do not drive yourself to the hospital.  You have severe chest pain, especially if the pain is crushing or pressure-like and spreads to the arms, back, neck, or jaw.  You have stroke-like symptoms, such as:   Loss of vision.   Difficulty talking.   Numbness or weakness on one side of your body.   Numbness or weakness in your arm or leg.   Not thinking clearly or feeling confused.    This information is not intended to replace advice given to you by your health care provider. Make sure you discuss any questions you have with your health care provider.   Document Released: 09/11/2008 Document Revised: 10/20/2014 Document Reviewed: 01/04/2014 Elsevier Interactive Patient Education 2016 Elsevier   Inc.  

## 2015-12-19 NOTE — Progress Notes (Signed)
Subjective:    Patient ID: Tracy Spencer, female    DOB: 12/05/1950, 65 y.o.   MRN: [id number]  HPI AWV- Annual Wellness Visit  The patient was seen for their annual wellness visit. The patient's past medical history, surgical history, and family history were reviewed. Pertinent vaccines were reviewed ( tetanus, pneumonia, shingles, flu) The patient's medication list was reviewed and updated.  The height and weight were entered. The patient's current BMI is: 31.48  Cognitive screening was completed. Outcome of Mini - Cog: passed Falls within the past 6 months: none  Current tobacco usage: non-smoker (All patients who use tobacco were given written and verbal information on quitting)  Recent listing of emergency department/hospitalizations over the past year were reviewed.  current specialist the patient sees on a regular basis: foot doctor   Medicare annual wellness visit patient questionnaire was reviewed.  A written screening schedule for the patient for the next 5-10 years was given. Appropriate discussion of followup regarding next visit was discussed.  Last colonoscopy- 02/07/2015  Patient has no concerns at this time.   patient is been doing well with watching her diet staying active volunteers a lot takes her medicine denies any exceptionally low sugar spells once or twice it was 80 5 in the morning. Denies shaking spells. Patient does try to watch diet take her cholesterol medicine.  Review of Systems  Constitutional: Negative for activity change, appetite change and fatigue.  HENT: Negative for congestion, ear discharge and rhinorrhea.   Eyes: Negative for discharge.  Respiratory: Negative for cough, chest tightness and wheezing.   Cardiovascular: Negative for chest pain.  Gastrointestinal: Negative for vomiting and abdominal pain.  Genitourinary: Negative for frequency and difficulty urinating.  Musculoskeletal: Negative for neck pain.  Allergic/Immunologic:  Negative for environmental allergies and food allergies.  Neurological: Negative for weakness and headaches.  Psychiatric/Behavioral: Negative for behavioral problems and agitation.       Objective:   Physical Exam  Constitutional: She is oriented to person, place, and time. She appears well-developed and well-nourished.  HENT:  Head: Normocephalic.  Right Ear: External ear normal.  Left Ear: External ear normal.  Eyes: Pupils are equal, round, and reactive to light.  Neck: Normal range of motion. No thyromegaly present.  Cardiovascular: Normal rate, regular rhythm, normal heart sounds and intact distal pulses.   No murmur heard. Pulmonary/Chest: Effort normal and breath sounds normal. No respiratory distress. She has no wheezes.  Abdominal: Soft. Bowel sounds are normal. She exhibits no distension and no mass. There is no tenderness.  Musculoskeletal: Normal range of motion. She exhibits no edema or tenderness.  Lymphadenopathy:    She has no cervical adenopathy.  Neurological: She is alert and oriented to person, place, and time. She exhibits normal muscle tone.  Skin: Skin is warm and dry.  Psychiatric: She has a normal mood and affect. Her behavior is normal.     patient was seen for diabetes and cholesterol in addition to wellness exam.      Assessment & Plan:   safety dietary all discussed.  Patient overall is doing well cognitively is doing well. Not having any major issues.  Immunizations updated shingles vaccine recommended  Patient up-to-date on colonoscopy up-to-date on mammogram  Pap smear not indicated because of previous hysterectomy   Hyperlipidemia  Lab work looks good continue current measures Diabetes A1c looks excellent if starting to have low sugars below 90s in the morning may need to reduce medication  Blood pressure doesn't

## 2016-01-21 ENCOUNTER — Other Ambulatory Visit: Payer: Self-pay | Admitting: Family Medicine

## 2016-01-21 DIAGNOSIS — R69 Illness, unspecified: Secondary | ICD-10-CM | POA: Diagnosis not present

## 2016-01-22 ENCOUNTER — Other Ambulatory Visit: Payer: Self-pay | Admitting: Family Medicine

## 2016-01-31 ENCOUNTER — Other Ambulatory Visit: Payer: Self-pay | Admitting: Family Medicine

## 2016-02-20 DIAGNOSIS — H5203 Hypermetropia, bilateral: Secondary | ICD-10-CM | POA: Diagnosis not present

## 2016-02-20 DIAGNOSIS — H25013 Cortical age-related cataract, bilateral: Secondary | ICD-10-CM | POA: Diagnosis not present

## 2016-02-20 DIAGNOSIS — H52223 Regular astigmatism, bilateral: Secondary | ICD-10-CM | POA: Diagnosis not present

## 2016-02-20 DIAGNOSIS — H43811 Vitreous degeneration, right eye: Secondary | ICD-10-CM | POA: Diagnosis not present

## 2016-03-11 ENCOUNTER — Other Ambulatory Visit: Payer: Self-pay | Admitting: Family Medicine

## 2016-04-10 DIAGNOSIS — H25092 Other age-related incipient cataract, left eye: Secondary | ICD-10-CM | POA: Diagnosis not present

## 2016-04-10 DIAGNOSIS — E119 Type 2 diabetes mellitus without complications: Secondary | ICD-10-CM | POA: Diagnosis not present

## 2016-04-10 DIAGNOSIS — H25811 Combined forms of age-related cataract, right eye: Secondary | ICD-10-CM | POA: Diagnosis not present

## 2016-04-10 DIAGNOSIS — H35373 Puckering of macula, bilateral: Secondary | ICD-10-CM | POA: Diagnosis not present

## 2016-04-11 DIAGNOSIS — H25811 Combined forms of age-related cataract, right eye: Secondary | ICD-10-CM | POA: Diagnosis not present

## 2016-04-16 DIAGNOSIS — R69 Illness, unspecified: Secondary | ICD-10-CM | POA: Diagnosis not present

## 2016-04-16 NOTE — Patient Instructions (Signed)
Tracy Spencer  04/16/2016     @PREFPERIOPPHARMACY @   Your procedure is scheduled on 04/21/2016.  Report to [facility] Spencer at 7;00 A.M.  Call this number if you have problems the morning of surgery:  [phone]   Remember:  Do not eat food or drink liquids after midnight.  Take these medicines the morning of surgery with A SIP OF WATER : NORVASC, ZYRTEC, HYZAAR AND FLONASE   Do not wear jewelry, make-up or nail polish.  Do not wear lotions, powders, or perfumes.  You may wear deoderant.  Do not shave 48 hours prior to surgery.  Men may shave face and neck.  Do not bring valuables to the hospital.  [facility] any belongings or valuables.  Contacts, dentures or bridgework may not be worn into surgery.  Leave your suitcase in the car.  After surgery it may be brought to your room.  For patients admitted to the hospital, discharge time will be determined by your treatment team.  Patients discharged the day of surgery will not be allowed to drive home.   Name and phone number of your driver:   FAMILY Special instructions:  N/A  Please read over the following fact sheets that you were given. Care and Recovery After Surgery   Cataract A cataract is a clouding of the lens of the eye. When a lens becomes cloudy, vision is reduced based on the degree and nature of the clouding. Many cataracts reduce vision to some degree. Some cataracts make people more near-sighted as they develop. Other cataracts increase glare. Cataracts that are ignored and become worse can sometimes look white. The white color can be seen through the pupil. CAUSES   Aging. However, cataracts may occur at any age, even in newborns.  Certain drugs.  Trauma to the eye.  Certain diseases such as diabetes.  Specific eye diseases such as chronic inflammation inside the eye or a sudden attack of a rare form of glaucoma.  Inherited or acquired medical problems. SYMPTOMS   Gradual,  progressive drop in vision in the affected eye.  Severe, rapid visual loss. This most often happens when trauma is the cause. DIAGNOSIS  To detect a cataract, an eye doctor examines the lens. Cataracts are best diagnosed with an exam of the eyes with the pupils enlarged (dilated) by drops.  TREATMENT  For an early cataract, vision may improve by using different eyeglasses or stronger lighting. If that does not help your vision, surgery is the only effective treatment. A cataract needs to be surgically removed when vision loss interferes with your everyday activities, such as driving, reading, or watching TV. A cataract may also have to be removed if it prevents examination or treatment of another eye problem. Surgery removes the cloudy lens and usually replaces it with a substitute lens (intraocular lens, IOL).  At a time when both you and your doctor agree, the cataract will be surgically removed. If you have cataracts in both eyes, only one is usually removed at a time. This allows the operated eye to heal and be out of danger from any possible problems after surgery (such as infection or poor wound healing). In rare cases, a cataract may be doing damage to your eye. In these cases, your caregiver may advise surgical removal right away. The vast majority of people who have cataract surgery have better vision afterward. HOME CARE INSTRUCTIONS  If you are not planning surgery, you may be asked to do  the following:  Use different eyeglasses.  Use stronger or brighter lighting.  Ask your eye doctor about reducing your medicine dose or changing medicines if it is thought that a medicine caused your cataract. Changing medicines does not make the cataract go away on its own.  Become familiar with your surroundings. Poor vision can lead to injury. Avoid bumping into things on the affected side. You are at a higher risk for tripping or falling.  Exercise extreme care when driving or operating  machinery.  Wear sunglasses if you are sensitive to bright light or experiencing problems with glare. SEEK IMMEDIATE MEDICAL CARE IF:   You have a worsening or sudden vision loss.  You notice redness, swelling, or increasing pain in the eye.  You have a fever.   This information is not intended to replace advice given to you by your health care provider. Make sure you discuss any questions you have with your health care provider.   Document Released: 09/29/2005 Document Revised: 12/22/2011 Document Reviewed: 04/04/2015 Elsevier Interactive Patient Education [employer]2016 Elsevier Inc.

## 2016-04-18 ENCOUNTER — Encounter (HOSPITAL_COMMUNITY)
Admission: RE | Admit: 2016-04-18 | Discharge: 2016-04-18 | Disposition: A | Discharge status: Home / Self Care | Payer: Medicare HMO | Source: Ambulatory Visit | Attending: Ophthalmology | Admitting: Ophthalmology

## 2016-04-18 ENCOUNTER — Encounter (HOSPITAL_COMMUNITY): Payer: Self-pay

## 2016-04-18 DIAGNOSIS — H269 Unspecified cataract: Secondary | ICD-10-CM | POA: Diagnosis not present

## 2016-04-18 DIAGNOSIS — I10 Essential (primary) hypertension: Secondary | ICD-10-CM | POA: Diagnosis not present

## 2016-04-18 DIAGNOSIS — Z79899 Other long term (current) drug therapy: Secondary | ICD-10-CM | POA: Diagnosis not present

## 2016-04-18 DIAGNOSIS — E1136 Type 2 diabetes mellitus with diabetic cataract: Secondary | ICD-10-CM | POA: Diagnosis not present

## 2016-04-18 DIAGNOSIS — Z7984 Long term (current) use of oral hypoglycemic drugs: Secondary | ICD-10-CM | POA: Diagnosis not present

## 2016-04-18 DIAGNOSIS — J302 Other seasonal allergic rhinitis: Secondary | ICD-10-CM | POA: Diagnosis not present

## 2016-04-18 HISTORY — DX: Type 2 diabetes mellitus without complications: E11.9

## 2016-04-18 LAB — CBC WITH DIFFERENTIAL/PLATELET
Basophils Absolute: 0 10*3/uL (ref 0.0–0.1)
Basophils Relative: 1 %
EOS ABS: 0.1 10*3/uL (ref 0.0–0.7)
EOS PCT: 3 %
HCT: 35 % — ABNORMAL LOW (ref 36.0–46.0)
Hemoglobin: 11.4 g/dL — ABNORMAL LOW (ref 12.0–15.0)
LYMPHS ABS: 1.7 10*3/uL (ref 0.7–4.0)
Lymphocytes Relative: 34 %
MCH: 27 pg (ref 26.0–34.0)
MCHC: 32.6 g/dL (ref 30.0–36.0)
MCV: 82.7 fL (ref 78.0–100.0)
MONO ABS: 0.3 10*3/uL (ref 0.1–1.0)
MONOS PCT: 6 %
Neutro Abs: 2.8 10*3/uL (ref 1.7–7.7)
Neutrophils Relative %: 56 %
PLATELETS: 184 10*3/uL (ref 150–400)
RBC: 4.23 MIL/uL (ref 3.87–5.11)
RDW: 14.5 % (ref 11.5–15.5)
WBC: 4.9 10*3/uL (ref 4.0–10.5)

## 2016-04-18 LAB — BASIC METABOLIC PANEL
Anion gap: 7 (ref 5–15)
BUN: 15 mg/dL (ref 6–20)
CALCIUM: 9.4 mg/dL (ref 8.9–10.3)
CO2: 29 mmol/L (ref 22–32)
Chloride: 102 mmol/L (ref 101–111)
Creatinine, Ser: 0.93 mg/dL (ref 0.44–1.00)
GFR calc Af Amer: >60 mL/min (ref >60)
GFR calc non Af Amer: >60 mL/min (ref >60)
GLUCOSE: 113 mg/dL — AB (ref 65–99)
Potassium: 3.5 mmol/L (ref 3.5–5.1)
SODIUM: 138 mmol/L (ref 135–145)

## 2016-04-21 ENCOUNTER — Encounter (HOSPITAL_COMMUNITY)
Admission: RE | Disposition: A | Discharge status: Home / Self Care | Payer: Self-pay | Source: Ambulatory Visit | Attending: Ophthalmology

## 2016-04-21 ENCOUNTER — Encounter (HOSPITAL_COMMUNITY): Payer: Self-pay | Admitting: Anesthesiology

## 2016-04-21 ENCOUNTER — Ambulatory Visit (HOSPITAL_COMMUNITY)
Admission: RE | Admit: 2016-04-21 | Discharge: 2016-04-21 | Disposition: A | Discharge status: Home / Self Care | Payer: Medicare HMO | Source: Ambulatory Visit | Attending: Ophthalmology | Admitting: Ophthalmology

## 2016-04-21 ENCOUNTER — Ambulatory Visit (HOSPITAL_COMMUNITY): Payer: Medicare HMO | Admitting: Anesthesiology

## 2016-04-21 DIAGNOSIS — H269 Unspecified cataract: Secondary | ICD-10-CM | POA: Diagnosis not present

## 2016-04-21 DIAGNOSIS — J302 Other seasonal allergic rhinitis: Secondary | ICD-10-CM | POA: Insufficient documentation

## 2016-04-21 DIAGNOSIS — I10 Essential (primary) hypertension: Secondary | ICD-10-CM | POA: Insufficient documentation

## 2016-04-21 DIAGNOSIS — E1136 Type 2 diabetes mellitus with diabetic cataract: Secondary | ICD-10-CM | POA: Diagnosis not present

## 2016-04-21 DIAGNOSIS — Z7984 Long term (current) use of oral hypoglycemic drugs: Secondary | ICD-10-CM | POA: Diagnosis not present

## 2016-04-21 DIAGNOSIS — Z79899 Other long term (current) drug therapy: Secondary | ICD-10-CM | POA: Diagnosis not present

## 2016-04-21 DIAGNOSIS — H25811 Combined forms of age-related cataract, right eye: Secondary | ICD-10-CM | POA: Diagnosis not present

## 2016-04-21 HISTORY — PX: CATARACT EXTRACTION W/PHACO: SHX586

## 2016-04-21 LAB — GLUCOSE, CAPILLARY: Glucose-Capillary: 101 mg/dL — ABNORMAL HIGH (ref 65–99)

## 2016-04-21 SURGERY — PHACOEMULSIFICATION, CATARACT, WITH IOL INSERTION
Anesthesia: Monitor Anesthesia Care | Site: Eye | Laterality: Right

## 2016-04-21 MED ORDER — LIDOCAINE HCL 3.5 % OP GEL
1.0000 "application " | Freq: Once | OPHTHALMIC | Status: AC
  Administered 2016-04-21: 1 via OPHTHALMIC

## 2016-04-21 MED ORDER — LACTATED RINGERS IV SOLN
INTRAVENOUS | Status: DC
  Administered 2016-04-21: 1000 mL via INTRAVENOUS

## 2016-04-21 MED ORDER — EPINEPHRINE HCL 1 MG/ML IJ SOLN
INTRAOCULAR | Status: DC | PRN
  Administered 2016-04-21: 500 mL

## 2016-04-21 MED ORDER — BSS IO SOLN
INTRAOCULAR | Status: DC | PRN
  Administered 2016-04-21: 15 mL

## 2016-04-21 MED ORDER — MIDAZOLAM HCL 2 MG/2ML IJ SOLN
1.0000 mg | INTRAMUSCULAR | Status: DC | PRN
  Administered 2016-04-21: 2 mg via INTRAVENOUS

## 2016-04-21 MED ORDER — PHENYLEPHRINE HCL 2.5 % OP SOLN
1.0000 [drp] | OPHTHALMIC | Status: AC
  Administered 2016-04-21 (×3): 1 [drp] via OPHTHALMIC

## 2016-04-21 MED ORDER — PROVISC 10 MG/ML IO SOLN
INTRAOCULAR | Status: DC | PRN
  Administered 2016-04-21: 0.85 mL via INTRAOCULAR

## 2016-04-21 MED ORDER — CYCLOPENTOLATE-PHENYLEPHRINE 0.2-1 % OP SOLN
1.0000 [drp] | OPHTHALMIC | Status: AC
  Administered 2016-04-21 (×3): 1 [drp] via OPHTHALMIC

## 2016-04-21 MED ORDER — LIDOCAINE 3.5 % OP GEL OPTIME - NO CHARGE
OPHTHALMIC | Status: DC | PRN
  Administered 2016-04-21: 1 [drp] via OPHTHALMIC

## 2016-04-21 MED ORDER — MIDAZOLAM HCL 2 MG/2ML IJ SOLN
INTRAMUSCULAR | Status: AC
  Filled 2016-04-21: qty 2

## 2016-04-21 MED ORDER — POVIDONE-IODINE 5 % OP SOLN
OPHTHALMIC | Status: DC | PRN
  Administered 2016-04-21: 1 via OPHTHALMIC

## 2016-04-21 MED ORDER — NEOMYCIN-POLYMYXIN-DEXAMETH 3.5-10000-0.1 OP SUSP
OPHTHALMIC | Status: DC | PRN
  Administered 2016-04-21: 2 [drp] via OPHTHALMIC

## 2016-04-21 MED ORDER — EPINEPHRINE HCL 1 MG/ML IJ SOLN
INTRAMUSCULAR | Status: AC
  Filled 2016-04-21: qty 1

## 2016-04-21 MED ORDER — FENTANYL CITRATE (PF) 100 MCG/2ML IJ SOLN
25.0000 ug | INTRAMUSCULAR | Status: AC | PRN
  Administered 2016-04-21 (×2): 25 ug via INTRAVENOUS

## 2016-04-21 MED ORDER — LIDOCAINE HCL (PF) 1 % IJ SOLN
INTRAMUSCULAR | Status: DC | PRN
  Administered 2016-04-21: .6 mL

## 2016-04-21 MED ORDER — TETRACAINE HCL 0.5 % OP SOLN
1.0000 [drp] | OPHTHALMIC | Status: AC
  Administered 2016-04-21 (×3): 1 [drp] via OPHTHALMIC

## 2016-04-21 MED ORDER — FENTANYL CITRATE (PF) 100 MCG/2ML IJ SOLN
INTRAMUSCULAR | Status: AC
  Filled 2016-04-21: qty 2

## 2016-04-21 SURGICAL SUPPLY — 11 items
CLOTH BEACON ORANGE TIMEOUT ST (SAFETY) ×1 IMPLANT
EYE SHIELD UNIVERSAL CLEAR (GAUZE/BANDAGES/DRESSINGS) ×1 IMPLANT
GLOVE BIOGEL PI IND STRL 7.0 (GLOVE) IMPLANT
GLOVE BIOGEL PI INDICATOR 7.0 (GLOVE) ×1
GLOVE EXAM NITRILE MD LF STRL (GLOVE) ×1 IMPLANT
PAD ARMBOARD 7.5X6 YLW CONV (MISCELLANEOUS) ×1 IMPLANT
SIGHTPATH CAT PROC W REG LENS (Ophthalmic Related) ×2 IMPLANT
SYRINGE LUER LOK 1CC (MISCELLANEOUS) ×1 IMPLANT
TAPE SURG TRANSPORE 1 IN (GAUZE/BANDAGES/DRESSINGS) IMPLANT
TAPE SURGICAL TRANSPORE 1 IN (GAUZE/BANDAGES/DRESSINGS) ×1
WATER STERILE IRR 250ML POUR (IV SOLUTION) ×1 IMPLANT

## 2016-04-21 NOTE — Anesthesia Postprocedure Evaluation (Signed)
Anesthesia Post Note  Patient: Minette HeadlandGeneva S Novakovich  Procedure(s) Performed: Procedure(s) (LRB): CATARACT EXTRACTION PHACO AND INTRAOCULAR LENS PLACEMENT (IOC) (Right)  Patient location during evaluation: Short Stay Anesthesia Type: MAC Level of consciousness: awake and alert Pain management: pain level controlled Vital Signs Assessment: post-procedure vital signs reviewed and stable Respiratory status: spontaneous breathing Cardiovascular status: blood pressure returned to baseline Postop Assessment: no signs of nausea or vomiting Anesthetic complications: no    Last Vitals:  Filed Vitals:   04/21/16 0840 04/21/16 0845  BP: 115/68 109/61  Temp:    Resp: 14 12    Last Pain: There were no vitals filed for this visit.               Xaniyah Buchholz

## 2016-04-21 NOTE — Op Note (Signed)
Date of Admission: 04/21/2016  Date of Surgery: 04/21/2016   Pre-Op Dx: Cataract Right Eye  Post-Op Dx: Senile Combined Cataract Right  Eye,  Dx Code Z61.096H25.811  Surgeon: Gemma PayorKerry Kirby Argueta, M.D.  Assistants: None  Anesthesia: Topical with MAC  Indications: Painless, progressive loss of vision with compromise of daily activities.  Surgery: Cataract Extraction with Intraocular lens Implant Right Eye  Discription: The patient had dilating drops and viscous lidocaine placed into the Right eye in the pre-op holding area. After transfer to the operating room, a time out was performed. The patient was then prepped and draped. Beginning with a 75 degree blade a paracentesis port was made at the surgeon's 2 o'clock position. The anterior chamber was then filled with 1% non-preserved lidocaine. This was followed by filling the anterior chamber with Provisc.  A 2.634mm keratome blade was used to make a clear corneal incision at the temporal limbus.  A bent cystatome needle was used to create a continuous tear capsulotomy. Hydrodissection was performed with balanced salt solution on a Fine canula. The lens nucleus was then removed using the phacoemulsification handpiece. Residual cortex was removed with the I&A handpiece. The anterior chamber and capsular bag were refilled with Provisc. A posterior chamber intraocular lens was placed into the capsular bag with it's injector. The implant was positioned with the Kuglan hook. The Provisc was then removed from the anterior chamber and capsular bag with the I&A handpiece. Stromal hydration of the main incision and paracentesis port was performed with BSS on a Fine canula. The wounds were tested for leak which was negative. The patient tolerated the procedure well. There were no operative complications. The patient was then transferred to the recovery room in stable condition.  Complications: None  Specimen: None  EBL: None  Prosthetic device: Hoya iSert 250, power 19.5  D, SN [id number].

## 2016-04-21 NOTE — H&P (Signed)
I have reviewed the H&P, the patient was re-examined, and I have identified no interval changes in medical condition and plan of care since the history and physical of record  

## 2016-04-21 NOTE — Transfer of Care (Signed)
Immediate Anesthesia Transfer of Care Note  Patient: Tracy Spencer  Procedure(s) Performed: Procedure(s) with comments: CATARACT EXTRACTION PHACO AND INTRAOCULAR LENS PLACEMENT (IOC) (Right) - CDE:10.80  Patient Location: Short Stay  Anesthesia Type:MAC  Level of Consciousness: awake  Airway & Oxygen Therapy: Patient Spontanous Breathing  Post-op Assessment: Report given to RN  Post vital signs: Reviewed  Last Vitals:  Filed Vitals:   04/21/16 0840 04/21/16 0845  BP: 115/68 109/61  Temp:    Resp: 14 12    Last Pain: There were no vitals filed for this visit.    Patients Stated Pain Goal: 5 (04/21/16 0810)  Complications: No apparent anesthesia complications

## 2016-04-21 NOTE — Anesthesia Preprocedure Evaluation (Signed)
Anesthesia Evaluation  Patient identified by MRN, date of birth, ID band Patient awake    Reviewed: Allergy & Precautions, NPO status , Patient's Chart, lab work & pertinent test results  Airway Mallampati: II  TM Distance: >3 FB     Dental  (+) Teeth Intact   Pulmonary neg pulmonary ROS,    breath sounds clear to auscultation       Cardiovascular hypertension, Pt. on medications  Rhythm:Regular Rate:Normal     Neuro/Psych    GI/Hepatic negative GI ROS,   Endo/Other  diabetes, Type 2, Oral Hypoglycemic Agents  Renal/GU      Musculoskeletal   Abdominal   Peds  Hematology   Anesthesia Other Findings   Reproductive/Obstetrics                             Anesthesia Physical Anesthesia Plan  ASA: IV  Anesthesia Plan: MAC   Post-op Pain Management:    Induction: Intravenous  Airway Management Planned: Nasal Cannula  Additional Equipment:   Intra-op Plan:   Post-operative Plan:   Informed Consent: I have reviewed the patients History and Physical, chart, labs and discussed the procedure including the risks, benefits and alternatives for the proposed anesthesia with the patient or authorized representative who has indicated his/her understanding and acceptance.     Plan Discussed with:   Anesthesia Plan Comments:         Anesthesia Quick Evaluation

## 2016-04-21 NOTE — Discharge Instructions (Signed)
Anesthesia, Adult, Care After °Refer to this sheet in the next few weeks. These instructions provide you with information on caring for yourself after your procedure. Your health care provider may also give you more specific instructions. Your treatment has been planned according to current medical practices, but problems sometimes occur. Call your health care provider if you have any problems or questions after your procedure. °WHAT TO EXPECT AFTER THE PROCEDURE °After the procedure, it is typical to experience: °· Sleepiness. °· Nausea and vomiting. °HOME CARE INSTRUCTIONS °· For the first 24 hours after general anesthesia: °¨ Have a responsible person with you. °¨ Do not drive a car. If you are alone, do not take public transportation. °¨ Do not drink alcohol. °¨ Do not take medicine that has not been prescribed by your health care provider. °¨ Do not sign important papers or make important decisions. °¨ You may resume a normal diet and activities as directed by your health care provider. °· Change bandages (dressings) as directed. °· If you have questions or problems that seem related to general anesthesia, call the hospital and ask for the anesthetist or anesthesiologist on call. °SEEK MEDICAL CARE IF: °· You have nausea and vomiting that continue the day after anesthesia. °· You develop a rash. °SEEK IMMEDIATE MEDICAL CARE IF:  °· You have difficulty breathing. °· You have chest pain. °· You have any allergic problems. °  °This information is not intended to replace advice given to you by your health care provider. Make sure you discuss any questions you have with your health care provider. °  °Document Released: 01/05/2001 Document Revised: 10/20/2014 Document Reviewed: 01/28/2012 °Elsevier Interactive Patient Education ©2016 Elsevier Inc. ° °

## 2016-04-24 ENCOUNTER — Encounter (HOSPITAL_COMMUNITY): Payer: Self-pay | Admitting: Ophthalmology

## 2016-06-07 ENCOUNTER — Other Ambulatory Visit: Payer: Self-pay | Admitting: Family Medicine

## 2016-06-20 ENCOUNTER — Encounter: Payer: Self-pay | Admitting: Family Medicine

## 2016-06-20 ENCOUNTER — Ambulatory Visit (INDEPENDENT_AMBULATORY_CARE_PROVIDER_SITE_OTHER): Payer: Medicare HMO | Admitting: Family Medicine

## 2016-06-20 VITALS — BP 128/82 | Ht 65.0 in | Wt 193.2 lb

## 2016-06-20 DIAGNOSIS — R1084 Generalized abdominal pain: Secondary | ICD-10-CM

## 2016-06-20 DIAGNOSIS — E785 Hyperlipidemia, unspecified: Secondary | ICD-10-CM

## 2016-06-20 DIAGNOSIS — Z23 Encounter for immunization: Secondary | ICD-10-CM | POA: Diagnosis not present

## 2016-06-20 DIAGNOSIS — D509 Iron deficiency anemia, unspecified: Secondary | ICD-10-CM | POA: Diagnosis not present

## 2016-06-20 DIAGNOSIS — I10 Essential (primary) hypertension: Secondary | ICD-10-CM | POA: Diagnosis not present

## 2016-06-20 DIAGNOSIS — E119 Type 2 diabetes mellitus without complications: Secondary | ICD-10-CM | POA: Diagnosis not present

## 2016-06-20 LAB — POCT GLYCOSYLATED HEMOGLOBIN (HGB A1C): HEMOGLOBIN A1C: 6.1

## 2016-06-20 MED ORDER — POTASSIUM CHLORIDE CRYS ER 20 MEQ PO TBCR: EXTENDED_RELEASE_TABLET | ORAL | 2 refills | Status: DC

## 2016-06-20 MED ORDER — ROSUVASTATIN CALCIUM 40 MG PO TABS: 40.0000 mg | ORAL_TABLET | Freq: Every day | ORAL | 1 refills | Status: DC

## 2016-06-20 MED ORDER — METFORMIN HCL 500 MG PO TABS: 500.0000 mg | ORAL_TABLET | Freq: Two times a day (BID) | ORAL | 1 refills | Status: DC

## 2016-06-20 MED ORDER — GLIPIZIDE 5 MG PO TABS: ORAL_TABLET | ORAL | 6 refills | Status: DC

## 2016-06-20 MED ORDER — RANITIDINE HCL 300 MG PO TABS: 300.0000 mg | ORAL_TABLET | Freq: Every day | ORAL | 1 refills | Status: DC

## 2016-06-20 MED ORDER — LOSARTAN POTASSIUM-HCTZ 100-25 MG PO TABS: 1.0000 | ORAL_TABLET | Freq: Every day | ORAL | 1 refills | Status: DC

## 2016-06-20 NOTE — Progress Notes (Signed)
   Subjective:    Patient ID: Tracy Spencer, female    DOB: 1950/12/22, 65 y.o.   MRN: [id number]  Diabetes  She presents for her follow-up diabetic visit. She has type 2 diabetes mellitus. Pertinent negatives for hypoglycemia include no confusion. Pertinent negatives for diabetes include no chest pain, no fatigue, no polydipsia, no polyphagia and no weakness. Risk factors for coronary artery disease include diabetes mellitus, hypertension and post-menopausal. Current diabetic treatment includes oral agent (dual therapy). She is compliant with treatment all of the time. Her weight is stable. She is following a diabetic diet.   Long discussion held regarding hyperlipidemia as well as hypertension taking medicines watching diet staying physically active new discussion held regarding a generalized abdominal discomfort feels a burning in the abdomen. Denies sweats chills or fever. Energy level subpar at times. No rectal bleeding. No vomiting. Has previous history hemoglobin being slightly low. Up-to-date on colonoscopy. Denies any blood in stool. The burning in the stomach is only been present over the past 10 days since going on a trip she thinks its possibly related to much tomatoes and too much coffee she is going to cut back on this   Review of Systems  Constitutional: Negative for activity change, appetite change and fatigue.  HENT: Negative for congestion.   Respiratory: Negative for cough.   Cardiovascular: Negative for chest pain.  Gastrointestinal: Negative for abdominal pain.  Endocrine: Negative for polydipsia and polyphagia.  Neurological: Negative for weakness.  Psychiatric/Behavioral: Negative for confusion.       Objective:   Physical Exam  Constitutional: She appears well-nourished. No distress.  Cardiovascular: Normal rate, regular rhythm and normal heart sounds.   No murmur heard. Pulmonary/Chest: Effort normal and breath sounds normal. No respiratory distress.    Musculoskeletal: She exhibits no edema.  Lymphadenopathy:    She has no cervical adenopathy.  Neurological: She is alert. She exhibits normal muscle tone.  Psychiatric: Her behavior is normal.  Vitals reviewed.         Assessment & Plan:  Diabetes overall good control watching diet taking medicine to stay physically active try to lose weight if possible  History of anemia concerning for possibility of iron deficient anemia check lab work await the results  Intermittent burning in the abdomen over the past 10 days I recommend Zantac on a regular basis for the next month if ongoing troubles to notify us. No need for intervention unless ongoing troubles  Hyperlipidemia watch diet continue medication. Previous lab work reviewed Lab work ordered  HTN good control watch salt diet stay physically active  25 minutes was spent with the patient. Greater than half the time was spent in discussion and answering questions and counseling regarding the issues that the patient came in for today.

## 2016-06-24 ENCOUNTER — Ambulatory Visit (INDEPENDENT_AMBULATORY_CARE_PROVIDER_SITE_OTHER): Payer: Medicare HMO

## 2016-06-24 DIAGNOSIS — E785 Hyperlipidemia, unspecified: Secondary | ICD-10-CM | POA: Diagnosis not present

## 2016-06-24 DIAGNOSIS — D509 Iron deficiency anemia, unspecified: Secondary | ICD-10-CM | POA: Diagnosis not present

## 2016-06-24 DIAGNOSIS — R1084 Generalized abdominal pain: Secondary | ICD-10-CM | POA: Diagnosis not present

## 2016-06-24 DIAGNOSIS — E119 Type 2 diabetes mellitus without complications: Secondary | ICD-10-CM | POA: Diagnosis not present

## 2016-06-24 DIAGNOSIS — Z111 Encounter for screening for respiratory tuberculosis: Secondary | ICD-10-CM | POA: Diagnosis not present

## 2016-06-24 DIAGNOSIS — I10 Essential (primary) hypertension: Secondary | ICD-10-CM | POA: Diagnosis not present

## 2016-06-25 LAB — BASIC METABOLIC PANEL
BUN / CREAT RATIO: 11 — AB (ref 12–28)
BUN: 11 mg/dL (ref 8–27)
CHLORIDE: 99 mmol/L (ref 96–106)
CO2: 26 mmol/L (ref 18–29)
Calcium: 10.1 mg/dL (ref 8.7–10.3)
Creatinine, Ser: 0.98 mg/dL (ref 0.57–1.00)
GFR calc non Af Amer: 60 mL/min/{1.73_m2} (ref >59)
GFR, EST AFRICAN AMERICAN: 69 mL/min/{1.73_m2} (ref >59)
GLUCOSE: 94 mg/dL (ref 65–99)
Potassium: 4.1 mmol/L (ref 3.5–5.2)
SODIUM: 141 mmol/L (ref 134–144)

## 2016-06-25 LAB — CBC WITH DIFFERENTIAL/PLATELET
BASOS ABS: 0 10*3/uL (ref 0.0–0.2)
Basos: 1 %
EOS (ABSOLUTE): 0.2 10*3/uL (ref 0.0–0.4)
Eos: 3 %
Hematocrit: 34.8 % (ref 34.0–46.6)
Hemoglobin: 11.5 g/dL (ref 11.1–15.9)
Immature Grans (Abs): 0 10*3/uL (ref 0.0–0.1)
Immature Granulocytes: 0 %
LYMPHS ABS: 1.7 10*3/uL (ref 0.7–3.1)
Lymphs: 38 %
MCH: 26.2 pg — ABNORMAL LOW (ref 26.6–33.0)
MCHC: 33 g/dL (ref 31.5–35.7)
MCV: 79 fL (ref 79–97)
MONOS ABS: 0.3 10*3/uL (ref 0.1–0.9)
Monocytes: 8 %
Neutrophils Absolute: 2.2 10*3/uL (ref 1.4–7.0)
Neutrophils: 50 %
Platelets: 229 10*3/uL (ref 150–379)
RBC: 4.39 x10E6/uL (ref 3.77–5.28)
RDW: 14.2 % (ref 12.3–15.4)
WBC: 4.4 10*3/uL (ref 3.4–10.8)

## 2016-06-25 LAB — LIPID PANEL
CHOL/HDL RATIO: 2.5 ratio (ref 0.0–4.4)
Cholesterol, Total: 170 mg/dL (ref 100–199)
HDL: 68 mg/dL (ref >39)
LDL Calculated: 88 mg/dL (ref 0–99)
TRIGLYCERIDES: 72 mg/dL (ref 0–149)
VLDL Cholesterol Cal: 14 mg/dL (ref 5–40)

## 2016-06-25 LAB — HEPATIC FUNCTION PANEL
ALK PHOS: 58 IU/L (ref 39–117)
ALT: 14 IU/L (ref 0–32)
AST: 17 IU/L (ref 0–40)
Albumin: 4.7 g/dL (ref 3.6–4.8)
BILIRUBIN, DIRECT: 0.08 mg/dL (ref 0.00–0.40)
Bilirubin Total: 0.3 mg/dL (ref 0.0–1.2)
Total Protein: 7.2 g/dL (ref 6.0–8.5)

## 2016-06-25 LAB — FERRITIN: FERRITIN: 10 ng/mL — AB (ref 15–150)

## 2016-06-25 LAB — LIPASE: LIPASE: 28 U/L (ref 0–59)

## 2016-06-26 LAB — TB SKIN TEST
INDURATION: 0 mm
TB SKIN TEST: NEGATIVE

## 2016-07-01 ENCOUNTER — Other Ambulatory Visit: Payer: Self-pay | Admitting: *Deleted

## 2016-07-01 DIAGNOSIS — Z139 Encounter for screening, unspecified: Secondary | ICD-10-CM

## 2016-07-01 LAB — POC HEMOCCULT BLD/STL (HOME/3-CARD/SCREEN)
Card #3 Fecal Occult Blood, POC: NEGATIVE
FECAL OCCULT BLD: NEGATIVE
Fecal Occult Blood, POC: NEGATIVE

## 2016-07-14 ENCOUNTER — Other Ambulatory Visit: Payer: Self-pay | Admitting: Family Medicine

## 2016-07-23 DIAGNOSIS — R69 Illness, unspecified: Secondary | ICD-10-CM | POA: Diagnosis not present

## 2016-07-28 ENCOUNTER — Other Ambulatory Visit: Payer: Self-pay | Admitting: Family Medicine

## 2016-10-24 DIAGNOSIS — R69 Illness, unspecified: Secondary | ICD-10-CM | POA: Diagnosis not present

## 2016-10-28 ENCOUNTER — Encounter: Payer: Self-pay | Admitting: Family Medicine

## 2016-10-28 ENCOUNTER — Ambulatory Visit (INDEPENDENT_AMBULATORY_CARE_PROVIDER_SITE_OTHER): Payer: Medicare HMO | Admitting: Family Medicine

## 2016-10-28 VITALS — BP 112/82 | Wt 189.8 lb

## 2016-10-28 DIAGNOSIS — B9689 Other specified bacterial agents as the cause of diseases classified elsewhere: Secondary | ICD-10-CM | POA: Diagnosis not present

## 2016-10-28 DIAGNOSIS — E119 Type 2 diabetes mellitus without complications: Secondary | ICD-10-CM | POA: Diagnosis not present

## 2016-10-28 DIAGNOSIS — I10 Essential (primary) hypertension: Secondary | ICD-10-CM

## 2016-10-28 DIAGNOSIS — E784 Other hyperlipidemia: Secondary | ICD-10-CM

## 2016-10-28 DIAGNOSIS — J019 Acute sinusitis, unspecified: Secondary | ICD-10-CM | POA: Diagnosis not present

## 2016-10-28 DIAGNOSIS — E7849 Other hyperlipidemia: Secondary | ICD-10-CM

## 2016-10-28 LAB — POCT GLYCOSYLATED HEMOGLOBIN (HGB A1C): Hemoglobin A1C: 5.9

## 2016-10-28 MED ORDER — AMOXICILLIN 500 MG PO TABS: 500.0000 mg | ORAL_TABLET | Freq: Three times a day (TID) | ORAL | 0 refills | Status: DC

## 2016-10-28 MED ORDER — GLIPIZIDE 5 MG PO TABS: ORAL_TABLET | ORAL | 6 refills | Status: DC

## 2016-10-28 NOTE — Patient Instructions (Addendum)
Reduce glipizide to 1/2 in the evening  Metformin one 2 times a day   follow up in 4 months sooner if you need us  As for your abd symptoms use ranitidine daily If belly symptoms are not better within 3 weeks call us Then we would need to run some test

## 2016-10-28 NOTE — Progress Notes (Signed)
   Subjective:    Patient ID: Tracy Spencer, female    DOB: [birth year], 66 y.o.   MRN: [id number]  HPI Patient in office today for follow up on diabetes. Patient states she is doing well.The patient denies any low sugar spells she does try to watch her diet she states physically active.  Patient relates a vague lower abdominal burning sensation that comes and goes in the lower abdomen no nausea with no vomiting no diarrhea or bloody stools. This is occurred off and on over the past week and half and only lasted a few minutes at a time does not seem to bother her or keep her from doing things or eating.  HTN good control patient does take her medicine on a regular basis watches salt in her diet  Hyperlipidemia takes her meds previous labs reviewed takes her medicine on a regular basis watches her diet  Watches diet Try to keep her weight in check  Moods overall doing good  Head congestion drainage sinus pressure coughing symptoms are opacified 6 days   Patient up-to-date on colonoscopy she's had hysterectomy she thinks both her ovaries were removed    Review of Systems She denies high fever chills sweats nausea vomiting diarrhea she does relate sinus symptoms as above plus also a vague lower abdominal symptoms denies rectal bleeding vaginal bleeding    Objective:   Physical Exam Lungs clear no crackles heart regular pulse normal abdomen soft no guarding rebound flanks nontender extremities no edema skin warm dry       Assessment & Plan:  Vague lower abdominal symptoms if if ongoing troubles patient may need to have ultrasound including pelvic ultrasound or possibility of CAT scan she was instructed to follow-up if not improving over the next 3 weeks  HTN good control continue current measures  Diabetes very good control-decrease glipizide to just half a tablet in the evening. Continue metformin twice daily follow-up 4 months check A1c patient was educated about low glucoses  and what to watch for  Hyperlipidemia previous labs reviewed importance of continuing medication watching diet discussed  Sinus infection antibiotics prescribed

## 2016-10-28 NOTE — Addendum Note (Signed)
Addended by: Margaretha SheffieldBROWN, Esperanza Madrazo S on: 10/28/2016 05:03 PM   Modules accepted: Orders

## 2016-10-28 NOTE — Progress Notes (Signed)
Glipizide directions updated in EPIC per Dr Lorin PicketScott

## 2016-12-09 DIAGNOSIS — E119 Type 2 diabetes mellitus without complications: Secondary | ICD-10-CM | POA: Diagnosis not present

## 2016-12-09 DIAGNOSIS — L851 Acquired keratosis [keratoderma] palmaris et plantaris: Secondary | ICD-10-CM | POA: Diagnosis not present

## 2016-12-09 DIAGNOSIS — L603 Nail dystrophy: Secondary | ICD-10-CM | POA: Diagnosis not present

## 2016-12-19 ENCOUNTER — Other Ambulatory Visit: Payer: Self-pay | Admitting: Family Medicine

## 2017-01-16 ENCOUNTER — Other Ambulatory Visit: Payer: Self-pay | Admitting: Family Medicine

## 2017-01-16 DIAGNOSIS — R69 Illness, unspecified: Secondary | ICD-10-CM | POA: Diagnosis not present

## 2017-01-23 ENCOUNTER — Other Ambulatory Visit: Payer: Self-pay | Admitting: Family Medicine

## 2017-02-11 ENCOUNTER — Telehealth: Payer: Self-pay | Admitting: Family Medicine

## 2017-02-11 DIAGNOSIS — E785 Hyperlipidemia, unspecified: Secondary | ICD-10-CM

## 2017-02-11 DIAGNOSIS — E119 Type 2 diabetes mellitus without complications: Secondary | ICD-10-CM

## 2017-02-11 DIAGNOSIS — D509 Iron deficiency anemia, unspecified: Secondary | ICD-10-CM

## 2017-02-11 NOTE — Telephone Encounter (Signed)
Pt is requesting lab orders to be sent over for an upcoming appt. Last labs per epic were: lipase,bmp,ferritin,cbc,hepatic,and lipid on 06/24/16.

## 2017-02-11 NOTE — Telephone Encounter (Signed)
Lipid, liver, metabolic 7, A1c, urineACR, CBC, ferritin-iron deficient anemia hyperlipidemia diabetes

## 2017-02-11 NOTE — Telephone Encounter (Signed)
Left message return call 02/11/2017

## 2017-02-16 NOTE — Telephone Encounter (Signed)
Blood work ordered in EPIC. Patient notified. 

## 2017-02-21 DIAGNOSIS — H524 Presbyopia: Secondary | ICD-10-CM | POA: Diagnosis not present

## 2017-02-21 DIAGNOSIS — E119 Type 2 diabetes mellitus without complications: Secondary | ICD-10-CM | POA: Diagnosis not present

## 2017-02-21 DIAGNOSIS — E785 Hyperlipidemia, unspecified: Secondary | ICD-10-CM | POA: Diagnosis not present

## 2017-02-21 DIAGNOSIS — D509 Iron deficiency anemia, unspecified: Secondary | ICD-10-CM | POA: Diagnosis not present

## 2017-02-21 LAB — HM DIABETES EYE EXAM

## 2017-02-22 LAB — CBC WITH DIFFERENTIAL/PLATELET
BASOS: 1 %
Basophils Absolute: 0 10*3/uL (ref 0.0–0.2)
EOS (ABSOLUTE): 0.1 10*3/uL (ref 0.0–0.4)
Eos: 3 %
HEMATOCRIT: 37.7 % (ref 34.0–46.6)
HEMOGLOBIN: 12.4 g/dL (ref 11.1–15.9)
IMMATURE GRANS (ABS): 0 10*3/uL (ref 0.0–0.1)
Immature Granulocytes: 0 %
LYMPHS: 32 %
Lymphocytes Absolute: 1.6 10*3/uL (ref 0.7–3.1)
MCH: 26.8 pg (ref 26.6–33.0)
MCHC: 32.9 g/dL (ref 31.5–35.7)
MCV: 82 fL (ref 79–97)
MONOCYTES: 8 %
MONOS ABS: 0.4 10*3/uL (ref 0.1–0.9)
Neutrophils Absolute: 2.8 10*3/uL (ref 1.4–7.0)
Neutrophils: 56 %
Platelets: 204 10*3/uL (ref 150–379)
RBC: 4.62 x10E6/uL (ref 3.77–5.28)
RDW: 16.1 % — AB (ref 12.3–15.4)
WBC: 4.8 10*3/uL (ref 3.4–10.8)

## 2017-02-22 LAB — LIPID PANEL
CHOL/HDL RATIO: 2.6 ratio (ref 0.0–4.4)
Cholesterol, Total: 169 mg/dL (ref 100–199)
HDL: 65 mg/dL (ref >39)
LDL Calculated: 90 mg/dL (ref 0–99)
TRIGLYCERIDES: 68 mg/dL (ref 0–149)
VLDL Cholesterol Cal: 14 mg/dL (ref 5–40)

## 2017-02-22 LAB — MICROALBUMIN / CREATININE URINE RATIO
Creatinine, Urine: 80.2 mg/dL
MICROALB/CREAT RATIO: 120.3 mg/g{creat} — AB (ref 0.0–30.0)
MICROALBUM., U, RANDOM: 96.5 ug/mL

## 2017-02-22 LAB — FERRITIN: Ferritin: 16 ng/mL (ref 15–150)

## 2017-02-22 LAB — HEPATIC FUNCTION PANEL
ALK PHOS: 58 IU/L (ref 39–117)
ALT: 13 IU/L (ref 0–32)
AST: 18 IU/L (ref 0–40)
Albumin: 4.7 g/dL (ref 3.6–4.8)
BILIRUBIN TOTAL: 0.3 mg/dL (ref 0.0–1.2)
BILIRUBIN, DIRECT: 0.11 mg/dL (ref 0.00–0.40)
TOTAL PROTEIN: 7.2 g/dL (ref 6.0–8.5)

## 2017-02-22 LAB — BASIC METABOLIC PANEL
BUN / CREAT RATIO: 13 (ref 12–28)
BUN: 14 mg/dL (ref 8–27)
CO2: 26 mmol/L (ref 18–29)
CREATININE: 1.04 mg/dL — AB (ref 0.57–1.00)
Calcium: 10.1 mg/dL (ref 8.7–10.3)
Chloride: 100 mmol/L (ref 96–106)
GFR, EST AFRICAN AMERICAN: 64 mL/min/{1.73_m2} (ref >59)
GFR, EST NON AFRICAN AMERICAN: 56 mL/min/{1.73_m2} — AB (ref >59)
GLUCOSE: 109 mg/dL — AB (ref 65–99)
Potassium: 4.3 mmol/L (ref 3.5–5.2)
SODIUM: 140 mmol/L (ref 134–144)

## 2017-02-22 LAB — HEMOGLOBIN A1C
Est. average glucose Bld gHb Est-mCnc: 137 mg/dL
HEMOGLOBIN A1C: 6.4 % — AB (ref 4.8–5.6)

## 2017-02-25 ENCOUNTER — Encounter: Payer: Self-pay | Admitting: Family Medicine

## 2017-02-25 ENCOUNTER — Ambulatory Visit (INDEPENDENT_AMBULATORY_CARE_PROVIDER_SITE_OTHER): Payer: Medicare HMO | Admitting: Family Medicine

## 2017-02-25 VITALS — Ht 65.0 in

## 2017-02-25 DIAGNOSIS — E784 Other hyperlipidemia: Secondary | ICD-10-CM | POA: Diagnosis not present

## 2017-02-25 DIAGNOSIS — E7849 Other hyperlipidemia: Secondary | ICD-10-CM

## 2017-02-25 DIAGNOSIS — E119 Type 2 diabetes mellitus without complications: Secondary | ICD-10-CM | POA: Diagnosis not present

## 2017-02-25 DIAGNOSIS — M816 Localized osteoporosis [Lequesne]: Secondary | ICD-10-CM | POA: Diagnosis not present

## 2017-02-25 DIAGNOSIS — I10 Essential (primary) hypertension: Secondary | ICD-10-CM | POA: Diagnosis not present

## 2017-02-25 DIAGNOSIS — Z1231 Encounter for screening mammogram for malignant neoplasm of breast: Secondary | ICD-10-CM

## 2017-02-25 DIAGNOSIS — D509 Iron deficiency anemia, unspecified: Secondary | ICD-10-CM | POA: Diagnosis not present

## 2017-02-25 NOTE — Progress Notes (Signed)
   Subjective:    Patient ID: Tracy Spencer, female    DOB: Jan 05, 1951, 66 y.o.   MRN: [id number]  Diabetes  She presents for her follow-up diabetic visit. She has type 2 diabetes mellitus. Pertinent negatives for hypoglycemia include no confusion. Pertinent negatives for diabetes include no chest pain, no fatigue, no polydipsia, no polyphagia and no weakness. Risk factors for coronary artery disease include diabetes mellitus, dyslipidemia and hypertension. Current diabetic treatment includes oral agent (dual therapy). She is compliant with treatment all of the time. Her weight is stable. She is following a diabetic diet. Eye exam is current.  Patient up-to-date on colonoscopy Takes her cholesterol medicine regular basis Takes her blood pressure medicine regular basis watches salt in her diet Stays physically active but is not doing exercise   Discuss recent lab work Review of Systems  Constitutional: Negative for activity change, appetite change and fatigue.  HENT: Negative for congestion.   Respiratory: Negative for cough.   Cardiovascular: Negative for chest pain.  Gastrointestinal: Negative for abdominal pain.  Endocrine: Negative for polydipsia and polyphagia.  Neurological: Negative for weakness.  Psychiatric/Behavioral: Negative for confusion.       Objective:   Physical Exam  Constitutional: She appears well-nourished. No distress.  Cardiovascular: Normal rate, regular rhythm and normal heart sounds.   No murmur heard. Pulmonary/Chest: Effort normal and breath sounds normal. No respiratory distress.  Musculoskeletal: She exhibits no edema.  Lymphadenopathy:    She has no cervical adenopathy.  Neurological: She is alert. She exhibits normal muscle tone.  Psychiatric: Her behavior is normal.  Vitals reviewed.         Assessment & Plan:  Hyperlipidemia continue current medications. Watch diet closely previous labs reviewed HTN good control blood pressure good  continue diet Diabetes good control continue current medicines Repeat blood work in 6 months time to monitor her kidney functions History osteoporosis patient is not on medication repeat bone density Iron deficient anemia. Check stool study. Patient will take iron tablet daily She does not one to do iron infusion. Mammogram recommended

## 2017-03-04 ENCOUNTER — Other Ambulatory Visit: Payer: Self-pay | Admitting: Family Medicine

## 2017-03-04 DIAGNOSIS — R69 Illness, unspecified: Secondary | ICD-10-CM | POA: Diagnosis not present

## 2017-03-05 ENCOUNTER — Other Ambulatory Visit: Payer: Self-pay | Admitting: *Deleted

## 2017-03-05 ENCOUNTER — Ambulatory Visit (HOSPITAL_COMMUNITY)
Admission: RE | Admit: 2017-03-05 | Discharge: 2017-03-05 | Disposition: A | Discharge status: Home / Self Care | Payer: Medicare HMO | Source: Ambulatory Visit | Attending: Family Medicine | Admitting: Family Medicine

## 2017-03-05 DIAGNOSIS — M816 Localized osteoporosis [Lequesne]: Secondary | ICD-10-CM | POA: Diagnosis not present

## 2017-03-05 DIAGNOSIS — D509 Iron deficiency anemia, unspecified: Secondary | ICD-10-CM

## 2017-03-05 DIAGNOSIS — Z1231 Encounter for screening mammogram for malignant neoplasm of breast: Secondary | ICD-10-CM | POA: Insufficient documentation

## 2017-03-05 DIAGNOSIS — M810 Age-related osteoporosis without current pathological fracture: Secondary | ICD-10-CM | POA: Diagnosis not present

## 2017-03-05 LAB — IFOBT (OCCULT BLOOD): IFOBT: NEGATIVE

## 2017-03-09 ENCOUNTER — Encounter: Payer: Self-pay | Admitting: Family Medicine

## 2017-03-17 MED ORDER — ALENDRONATE SODIUM 70 MG PO TABS: 70.0000 mg | ORAL_TABLET | ORAL | 5 refills | Status: DC

## 2017-03-17 NOTE — Addendum Note (Signed)
Addended by: Margaretha SheffieldBROWN, Raun Routh S on: 03/17/2017 04:54 PM   Modules accepted: Orders

## 2017-04-02 ENCOUNTER — Other Ambulatory Visit: Payer: Self-pay | Admitting: Family Medicine

## 2017-04-20 ENCOUNTER — Other Ambulatory Visit: Payer: Self-pay | Admitting: Family Medicine

## 2017-04-30 DIAGNOSIS — L218 Other seborrheic dermatitis: Secondary | ICD-10-CM | POA: Diagnosis not present

## 2017-04-30 DIAGNOSIS — L821 Other seborrheic keratosis: Secondary | ICD-10-CM | POA: Diagnosis not present

## 2017-05-01 ENCOUNTER — Other Ambulatory Visit: Payer: Self-pay | Admitting: Family Medicine

## 2017-06-09 ENCOUNTER — Other Ambulatory Visit: Payer: Self-pay | Admitting: Family Medicine

## 2017-06-09 DIAGNOSIS — R69 Illness, unspecified: Secondary | ICD-10-CM | POA: Diagnosis not present

## 2017-06-09 DIAGNOSIS — E119 Type 2 diabetes mellitus without complications: Secondary | ICD-10-CM | POA: Diagnosis not present

## 2017-06-09 DIAGNOSIS — B351 Tinea unguium: Secondary | ICD-10-CM | POA: Diagnosis not present

## 2017-07-08 ENCOUNTER — Other Ambulatory Visit: Payer: Self-pay | Admitting: Family Medicine

## 2017-07-09 ENCOUNTER — Encounter: Payer: Self-pay | Admitting: Family Medicine

## 2017-07-09 ENCOUNTER — Other Ambulatory Visit: Payer: Self-pay

## 2017-07-09 ENCOUNTER — Ambulatory Visit (INDEPENDENT_AMBULATORY_CARE_PROVIDER_SITE_OTHER): Payer: Medicare HMO | Admitting: Family Medicine

## 2017-07-09 VITALS — BP 138/84 | Ht 65.0 in | Wt 190.0 lb

## 2017-07-09 DIAGNOSIS — Z23 Encounter for immunization: Secondary | ICD-10-CM | POA: Diagnosis not present

## 2017-07-09 DIAGNOSIS — Z1159 Encounter for screening for other viral diseases: Secondary | ICD-10-CM

## 2017-07-09 DIAGNOSIS — M25551 Pain in right hip: Secondary | ICD-10-CM

## 2017-07-09 DIAGNOSIS — I10 Essential (primary) hypertension: Secondary | ICD-10-CM

## 2017-07-09 DIAGNOSIS — E7849 Other hyperlipidemia: Secondary | ICD-10-CM

## 2017-07-09 DIAGNOSIS — E119 Type 2 diabetes mellitus without complications: Secondary | ICD-10-CM

## 2017-07-09 NOTE — Patient Instructions (Signed)
Hip Exercises Ask your health care provider which exercises are safe for you. Do exercises exactly as told by your health care provider and adjust them as directed. It is normal to feel mild stretching, pulling, tightness, or discomfort as you do these exercises, but you should stop right away if you feel sudden pain or your pain gets worse.Do not begin these exercises until told by your health care provider. STRETCHING AND RANGE OF MOTION EXERCISES These exercises warm up your muscles and joints and improve the movement and flexibility of your hip. These exercises also help to relieve pain, numbness, and tingling. Exercise A: Hamstrings, Supine  1. Lie on your back. 2. Loop a belt or towel over the ball of your left / rightfoot. The ball of your foot is on the walking surface, right under your toes. 3. Straighten your left / rightknee and slowly pull on the belt to raise your leg. ? Do not let your left / right knee bend while you do this. ? Keep your other leg flat on the floor. ? Raise the left / right leg until you feel a gentle stretch behind your left / right knee or thigh. 4. Hold this position for __________ seconds. 5. Slowly return your leg to the starting position. Repeat __________ times. Complete this stretch __________ times a day. Exercise B: Hip Rotators  1. Lie on your back on a firm surface. 2. Hold your left / right knee with your left / right hand. Hold your ankle with your other hand. 3. Gently pull your left / right knee and rotate your lower leg toward your other shoulder. ? Pull until you feel a stretch in your buttocks. ? Keep your hips and shoulders firmly planted while you do this stretch. 4. Hold this position for __________ seconds. Repeat __________ times. Complete this stretch __________ times a day. Exercise C: V-Sit (Hamstrings and Adductors)  1. Sit on the floor with your legs extended in a large "V" shape. Keep your knees straight during this  exercise. 2. Start with your head and chest upright, then bend at your waist to reach for your left foot (position A). You should feel a stretch in your right inner thigh. 3. Hold this position for __________ seconds. Then slowly return to the upright position. 4. Bend at your waist to reach forward (position B). You should feel a stretch behind both of your thighs and knees. 5. Hold this position for __________ seconds. Then slowly return to the upright position. 6. Bend at your waist to reach for your right foot (position C). You should feel a stretch in your left inner thigh. 7. Hold this position for __________ seconds. Then slowly return to the upright position. Repeat __________ times. Complete this stretch __________ times a day. Exercise D: Lunge (Hip Flexors)  1. Place your left / right knee on the floor and bend your other knee so that is directly over your ankle. You should be half-kneeling. 2. Keep good posture with your head over your shoulders. 3. Tighten your buttocks to point your tailbone downward. This helps your back to keep from arching too much. 4. You should feel a gentle stretch in the front of your left / right thigh and hip. If you do not feel any resistance, slightly slide your other foot forward and then slowly lunge forward so your knee once again lines up over your ankle. 5. Make sure your tailbone continues to point downward. 6. Hold this position for __________ seconds. Repeat   __________ times. Complete this stretch __________ times a day. STRENGTHENING EXERCISES These exercises build strength and endurance in your hip. Endurance is the ability to use your muscles for a long time, even after they get tired. Exercise E: Bridge (Hip Extensors)  1. Lie on your back on a firm surface with your knees bent and your feet flat on the floor. 2. Tighten your buttocks muscles and lift your bottom off the floor until the trunk of your body is level with your thighs. ? Do not  arch your back. ? You should feel the muscles working in your buttocks and the back of your thighs. If you do not feel these muscles, slide your feet 1-2 inches (2.5-5 cm) farther away from your buttocks. 3. Hold this position for __________ seconds. 4. Slowly lower your hips to the starting position. 5. Let your muscles relax completely between repetitions. 6. If this exercise is too easy, try doing it with your arms crossed over your chest. Repeat __________ times. Complete this exercise __________ times a day. Exercise F: Straight Leg Raises - Hip Abductors  1. Lie on your side with your left / right leg in the top position. Lie so your head, shoulder, knee, and hip line up with each other. You may bend your bottom knee to help you balance. 2. Roll your hips slightly forward, so your hips are stacked directly over each other and your left / right knee is facing forward. 3. Leading with your heel, lift your top leg 4-6 inches (10-15 cm). You should feel the muscles in your outer hip lifting. ? Do not let your foot drift forward. ? Do not let your knee roll toward the ceiling. 4. Hold this position for __________ seconds. 5. Slowly return to the starting position. 6. Let your muscles relax completely between repetitions. Repeat __________ times. Complete this exercise __________ times a day. Exercise G: Straight Leg Raises - Hip Adductors  1. Lie on your side with your left / right leg in the bottom position. Lie so your head, shoulder, knee, and hip line up. You may place your upper foot in front to help you balance. 2. Roll your hips slightly forward, so your hips are stacked directly over each other and your left / right knee is facing forward. 3. Tense the muscles in your inner thigh and lift your bottom leg 4-6 inches (10-15 cm). 4. Hold this position for __________ seconds. 5. Slowly return to the starting position. 6. Let your muscles relax completely between repetitions. Repeat  __________ times. Complete this exercise __________ times a day. Exercise H: Straight Leg Raises - Quadriceps  1. Lie on your back with your left / right leg extended and your other knee bent. 2. Tense the muscles in the front of your left / right thigh. When you do this, you should see your kneecap slide up or see increased dimpling just above your knee. 3. Tighten these muscles even more and raise your leg 4-6 inches (10-15 cm) off the floor. 4. Hold this position for __________ seconds. 5. Keep these muscles tense as you lower your leg. 6. Relax the muscles slowly and completely between repetitions. Repeat __________ times. Complete this exercise __________ times a day. Exercise I: Hip Abductors, Standing 1. Tie one end of a rubber exercise band or tubing to a secure surface, such as a table or pole. 2. Loop the other end of the band or tubing around your left / right ankle. 3. Keeping your ankle with   the band or tubing directly opposite of the secured end, step away until there is tension in the tubing or band. Hold onto a chair as needed for balance. 4. Lift your left / right leg out to your side. While you do this: ? Keep your back upright. ? Keep your shoulders over your hips. ? Keep your toes pointing forward. ? Make sure to use your hip muscles to lift your leg. Do not "throw" your leg or tip your body to lift your leg. 5. Hold this position for __________ seconds. 6. Slowly return to the starting position. Repeat __________ times. Complete this exercise __________ times a day. Exercise J: Squats (Quadriceps) 1. Stand in a door frame so your feet and knees are in line with the frame. You may place your hands on the frame for balance. 2. Slowly bend your knees and lower your hips like you are going to sit in a chair. ? Keep your lower legs in a straight-up-and-down position. ? Do not let your hips go lower than your knees. ? Do not bend your knees lower than told by your health  care provider. ? If your hip pain increases, do not bend as low. 3. Hold this position for ___________ seconds. 4. Slowly push with your legs to return to standing. Do not use your hands to pull yourself to standing. Repeat __________ times. Complete this exercise __________ times a day. This information is not intended to replace advice given to you by your health care provider. Make sure you discuss any questions you have with your health care provider. Document Released: 10/17/2005 Document Revised: 06/23/2016 Document Reviewed: 09/24/2015 Elsevier Interactive Patient Education  2018 [employer]. Back Exercises If you have pain in your back, do these exercises 2-3 times each day or as told by your doctor. When the pain goes away, do the exercises once each day, but repeat the steps more times for each exercise (do more repetitions). If you do not have pain in your back, do these exercises once each day or as told by your doctor. Exercises Single Knee to Chest  Do these steps 3-5 times in a row for each leg: 1. Lie on your back on a firm bed or the floor with your legs stretched out. 2. Bring one knee to your chest. 3. Hold your knee to your chest by grabbing your knee or thigh. 4. Pull on your knee until you feel a gentle stretch in your lower back. 5. Keep doing the stretch for 10-30 seconds. 6. Slowly let go of your leg and straighten it.  Pelvic Tilt  Do these steps 5-10 times in a row: 1. Lie on your back on a firm bed or the floor with your legs stretched out. 2. Bend your knees so they point up to the ceiling. Your feet should be flat on the floor. 3. Tighten your lower belly (abdomen) muscles to press your lower back against the floor. This will make your tailbone point up to the ceiling instead of pointing down to your feet or the floor. 4. Stay in this position for 5-10 seconds while you gently tighten your muscles and breathe evenly.  Cat-Cow  Do these steps until your  lower back bends more easily: 1. Get on your hands and knees on a firm surface. Keep your hands under your shoulders, and keep your knees under your hips. You may put padding under your knees. 2. Let your head hang down, and make your tailbone point down to  the floor so your lower back is round like the back of a cat. 3. Stay in this position for 5 seconds. 4. Slowly lift your head and make your tailbone point up to the ceiling so your back hangs low (sags) like the back of a cow. 5. Stay in this position for 5 seconds.  Press-Ups  Do these steps 5-10 times in a row: 1. Lie on your belly (face-down) on the floor. 2. Place your hands near your head, about shoulder-width apart. 3. While you keep your back relaxed and keep your hips on the floor, slowly straighten your arms to raise the top half of your body and lift your shoulders. Do not use your back muscles. To make yourself more comfortable, you may change where you place your hands. 4. Stay in this position for 5 seconds. 5. Slowly return to lying flat on the floor.  Bridges  Do these steps 10 times in a row: 1. Lie on your back on a firm surface. 2. Bend your knees so they point up to the ceiling. Your feet should be flat on the floor. 3. Tighten your butt muscles and lift your butt off of the floor until your waist is almost as high as your knees. If you do not feel the muscles working in your butt and the back of your thighs, slide your feet 1-2 inches farther away from your butt. 4. Stay in this position for 3-5 seconds. 5. Slowly lower your butt to the floor, and let your butt muscles relax.  If this exercise is too easy, try doing it with your arms crossed over your chest. Belly Crunches  Do these steps 5-10 times in a row: 1. Lie on your back on a firm bed or the floor with your legs stretched out. 2. Bend your knees so they point up to the ceiling. Your feet should be flat on the floor. 3. Cross your arms over your  chest. 4. Tip your chin a little bit toward your chest but do not bend your neck. 5. Tighten your belly muscles and slowly raise your chest just enough to lift your shoulder blades a tiny bit off of the floor. 6. Slowly lower your chest and your head to the floor.  Back Lifts Do these steps 5-10 times in a row: 1. Lie on your belly (face-down) with your arms at your sides, and rest your forehead on the floor. 2. Tighten the muscles in your legs and your butt. 3. Slowly lift your chest off of the floor while you keep your hips on the floor. Keep the back of your head in line with the curve in your back. Look at the floor while you do this. 4. Stay in this position for 3-5 seconds. 5. Slowly lower your chest and your face to the floor.  Contact a doctor if:  Your back pain gets a lot worse when you do an exercise.  Your back pain does not lessen 2 hours after you exercise. If you have any of these problems, stop doing the exercises. Do not do them again unless your doctor says it is okay. Get help right away if:  You have sudden, very bad back pain. If this happens, stop doing the exercises. Do not do them again unless your doctor says it is okay. This information is not intended to replace advice given to you by your health care provider. Make sure you discuss any questions you have with your health care provider. Document Released:  11/01/2010 Document Revised: 03/06/2016 Document Reviewed: 11/23/2014 Elsevier Interactive Patient Education  [employer].

## 2017-07-09 NOTE — Progress Notes (Signed)
   Subjective:    Patient ID: Tracy Spencer, female    DOB: 07-17-1951, 66 y.o.   MRN: [id number]  Hip Pain   The incident occurred more than 1 week ago. There was no injury mechanism. The pain is present in the right hip. The pain is at a severity of 7/10. The pain is moderate. The pain has been fluctuating since onset. Pertinent negatives include no tingling. The symptoms are aggravated by movement. She has tried NSAIDs for the symptoms. The treatment provided mild relief.  Back Pain  The current episode started more than 1 month ago. The problem occurs every several days. The problem has been waxing and waning since onset. The pain is present in the lumbar spine. The quality of the pain is described as aching and shooting. The pain does not radiate. The pain is at a severity of 7/10. The pain is moderate. The symptoms are aggravated by bending, position and sitting. Pertinent negatives include no abdominal pain, bladder incontinence, fever, tingling or weakness. She has tried NSAIDs for the symptoms. The treatment provided mild relief.   Patient states no other concerns this visit.   Review of Systems  Constitutional: Negative for fever.  Gastrointestinal: Negative for abdominal pain.  Genitourinary: Negative for bladder incontinence.  Musculoskeletal: Positive for back pain.  Neurological: Negative for tingling and weakness.       Objective:   Physical Exam  Constitutional: She appears well-developed and well-nourished. No distress.  HENT:  Head: Normocephalic and atraumatic.  Eyes: Right eye exhibits no discharge. Left eye exhibits no discharge.  Neck: No tracheal deviation present.  Cardiovascular: Normal rate, regular rhythm and normal heart sounds.   No murmur heard. Pulmonary/Chest: Effort normal and breath sounds normal. No respiratory distress. She has no wheezes. She has no rales.  Musculoskeletal: She exhibits no edema.  Lymphadenopathy:    She has no cervical  adenopathy.  Neurological: She is alert. She exhibits normal muscle tone.  Skin: Skin is warm and dry. No erythema.  Psychiatric: Her behavior is normal.  Vitals reviewed.         Assessment & Plan:  Low back and hip pain I don't feel that this is sciatica I believe exercises anti-inflammatories will help patient refuses anti-inflammatories she was shown exercises and information printed if she does not see improvement over the next 7-10 days she will notify us and we will set up physical therapy  Comprehensive lab work ordered for diabetic follow-up later this year  Osteoporosis explanation given to the patient regarding medication

## 2017-07-16 ENCOUNTER — Other Ambulatory Visit: Payer: Self-pay | Admitting: Family Medicine

## 2017-08-24 DIAGNOSIS — I10 Essential (primary) hypertension: Secondary | ICD-10-CM | POA: Diagnosis not present

## 2017-08-24 DIAGNOSIS — E7849 Other hyperlipidemia: Secondary | ICD-10-CM | POA: Diagnosis not present

## 2017-08-24 DIAGNOSIS — Z1159 Encounter for screening for other viral diseases: Secondary | ICD-10-CM | POA: Diagnosis not present

## 2017-08-24 DIAGNOSIS — E119 Type 2 diabetes mellitus without complications: Secondary | ICD-10-CM | POA: Diagnosis not present

## 2017-08-25 LAB — BASIC METABOLIC PANEL
BUN/Creatinine Ratio: 14 (ref 12–28)
BUN: 15 mg/dL (ref 8–27)
CALCIUM: 10.5 mg/dL — AB (ref 8.7–10.3)
CO2: 27 mmol/L (ref 20–29)
Chloride: 99 mmol/L (ref 96–106)
Creatinine, Ser: 1.04 mg/dL — ABNORMAL HIGH (ref 0.57–1.00)
GFR calc Af Amer: 64 mL/min/{1.73_m2} (ref >59)
GFR calc non Af Amer: 55 mL/min/{1.73_m2} — ABNORMAL LOW (ref >59)
GLUCOSE: 103 mg/dL — AB (ref 65–99)
Potassium: 3.8 mmol/L (ref 3.5–5.2)
SODIUM: 141 mmol/L (ref 134–144)

## 2017-08-25 LAB — HEPATIC FUNCTION PANEL
ALT: 16 IU/L (ref 0–32)
AST: 20 IU/L (ref 0–40)
Albumin: 4.8 g/dL (ref 3.6–4.8)
Alkaline Phosphatase: 42 IU/L (ref 39–117)
BILIRUBIN TOTAL: 0.4 mg/dL (ref 0.0–1.2)
BILIRUBIN, DIRECT: 0.11 mg/dL (ref 0.00–0.40)
Total Protein: 7.7 g/dL (ref 6.0–8.5)

## 2017-08-25 LAB — LIPID PANEL
CHOL/HDL RATIO: 2.9 ratio (ref 0.0–4.4)
CHOLESTEROL TOTAL: 184 mg/dL (ref 100–199)
HDL: 63 mg/dL (ref >39)
LDL CALC: 107 mg/dL — AB (ref 0–99)
TRIGLYCERIDES: 72 mg/dL (ref 0–149)
VLDL Cholesterol Cal: 14 mg/dL (ref 5–40)

## 2017-08-25 LAB — HEMOGLOBIN A1C
Est. average glucose Bld gHb Est-mCnc: 126 mg/dL
HEMOGLOBIN A1C: 6 % — AB (ref 4.8–5.6)

## 2017-08-25 LAB — MICROALBUMIN / CREATININE URINE RATIO
CREATININE, UR: 125.5 mg/dL
Microalb/Creat Ratio: 7.9 mg/g creat (ref 0.0–30.0)
Microalbumin, Urine: 9.9 ug/mL

## 2017-08-25 LAB — HEPATITIS C ANTIBODY

## 2017-08-28 ENCOUNTER — Ambulatory Visit: Payer: Medicare HMO | Admitting: Family Medicine

## 2017-08-31 ENCOUNTER — Ambulatory Visit: Payer: Medicare HMO | Admitting: Family Medicine

## 2017-08-31 ENCOUNTER — Encounter: Payer: Self-pay | Admitting: Family Medicine

## 2017-08-31 VITALS — BP 118/80 | Ht 65.0 in | Wt 189.2 lb

## 2017-08-31 DIAGNOSIS — E119 Type 2 diabetes mellitus without complications: Secondary | ICD-10-CM | POA: Diagnosis not present

## 2017-08-31 DIAGNOSIS — M816 Localized osteoporosis [Lequesne]: Secondary | ICD-10-CM | POA: Diagnosis not present

## 2017-08-31 DIAGNOSIS — E7849 Other hyperlipidemia: Secondary | ICD-10-CM

## 2017-08-31 DIAGNOSIS — I10 Essential (primary) hypertension: Secondary | ICD-10-CM

## 2017-08-31 MED ORDER — METFORMIN HCL 500 MG PO TABS: 500.0000 mg | ORAL_TABLET | Freq: Two times a day (BID) | ORAL | 1 refills | Status: DC

## 2017-08-31 MED ORDER — ROSUVASTATIN CALCIUM 40 MG PO TABS: ORAL_TABLET | ORAL | 1 refills | Status: DC

## 2017-08-31 MED ORDER — POTASSIUM CHLORIDE CRYS ER 20 MEQ PO TBCR: EXTENDED_RELEASE_TABLET | ORAL | 1 refills | Status: DC

## 2017-08-31 MED ORDER — LOSARTAN POTASSIUM-HCTZ 100-25 MG PO TABS: 1.0000 | ORAL_TABLET | Freq: Every day | ORAL | 1 refills | Status: DC

## 2017-08-31 NOTE — Progress Notes (Signed)
   Subjective:    Patient ID: Tracy Spencer, female    DOB: [birth year], 66 y.o.   MRN: [id number]  Diabetes  She presents for her follow-up diabetic visit. She has type 2 diabetes mellitus. Pertinent negatives for hypoglycemia include no confusion. Pertinent negatives for diabetes include no chest pain, no fatigue, no polydipsia, no polyphagia and no weakness. She has not had a previous visit with a dietitian. She does not see a podiatrist.Eye exam is current.  Patient states osteoporosis medicine tolerating but at times causes indigestion she was told if it causes ongoing troubles she needs to let us know and we would change her over to IV formulation  Patient does take her cholesterol medicine highest dose possible tolerating it well watching diet lab work was checked  Blood pressure takes her medicine watch his diet tries to get some exercise and working part-time denies being depressed Lab work was reviewed with the patient in detail  Patient had labs drawn on 08/24/17  Review of Systems  Constitutional: Negative for activity change, appetite change and fatigue.  HENT: Negative for congestion.   Respiratory: Negative for cough.   Cardiovascular: Negative for chest pain.  Gastrointestinal: Negative for abdominal pain.  Endocrine: Negative for polydipsia and polyphagia.  Skin: Negative for color change.  Neurological: Negative for weakness.  Psychiatric/Behavioral: Negative for confusion.       Objective:   Physical Exam  Constitutional: She appears well-developed and well-nourished. No distress.  HENT:  Head: Normocephalic and atraumatic.  Eyes: Right eye exhibits no discharge. Left eye exhibits no discharge.  Neck: No tracheal deviation present.  Cardiovascular: Normal rate, regular rhythm and normal heart sounds.  No murmur heard. Pulmonary/Chest: Effort normal and breath sounds normal. No respiratory distress. She has no wheezes. She has no rales.  Musculoskeletal: She  exhibits no edema.  Lymphadenopathy:    She has no cervical adenopathy.  Neurological: She is alert. She exhibits normal muscle tone.  Skin: Skin is warm and dry. No erythema.  Psychiatric: Her behavior is normal.  Vitals reviewed.   25 minutes was spent with the patient. Greater than half the time was spent in discussion and answering questions and counseling regarding the issues that the patient came in for today.       Assessment & Plan:  HTN stable watch diet stay active continue medication  Diabetes under good control patient should do snacks on a regular basis to avoid low sugar spells if low sugar spells are happening frequently she needs to let us know healthy eating recommended  Hyperlipidemia decent control continue current measures  Hypercalcemia stop calcium supplements check lab work again in several weeks time if still elevated will need PTH testing  Osteoporosis tolerating medication

## 2017-08-31 NOTE — Patient Instructions (Signed)
Diabetes Mellitus and Food It is important for you to manage your blood sugar (glucose) level. Your blood glucose level can be greatly affected by what you eat. Eating healthier foods in the appropriate amounts throughout the day at about the same time each day will help you control your blood glucose level. It can also help slow or prevent worsening of your diabetes mellitus. Healthy eating may even help you improve the level of your blood pressure and reach or maintain a healthy weight. General recommendations for healthful eating and cooking habits include:  Eating meals and snacks regularly. Avoid going long periods of time without eating to lose weight.  Eating a diet that consists mainly of plant-based foods, such as fruits, vegetables, nuts, legumes, and whole grains.  Using low-heat cooking methods, such as baking, instead of high-heat cooking methods, such as deep frying.  Work with your dietitian to make sure you understand how to use the Nutrition Facts information on food labels. How can food affect me? Carbohydrates Carbohydrates affect your blood glucose level more than any other type of food. Your dietitian will help you determine how many carbohydrates to eat at each meal and teach you how to count carbohydrates. Counting carbohydrates is important to keep your blood glucose at a healthy level, especially if you are using insulin or taking certain medicines for diabetes mellitus. Alcohol Alcohol can cause sudden decreases in blood glucose (hypoglycemia), especially if you use insulin or take certain medicines for diabetes mellitus. Hypoglycemia can be a life-threatening condition. Symptoms of hypoglycemia (sleepiness, dizziness, and disorientation) are similar to symptoms of having too much alcohol. If your health care provider has given you approval to drink alcohol, do so in moderation and use the following guidelines:  Women should not have more than one drink per day, and men  should not have more than two drinks per day. One drink is equal to: ? 12 oz of beer. ? 5 oz of wine. ? 1 oz of hard liquor.  Do not drink on an empty stomach.  Keep yourself hydrated. Have water, diet soda, or unsweetened iced tea.  Regular soda, juice, and other mixers might contain a lot of carbohydrates and should be counted.  What foods are not recommended? As you make food choices, it is important to remember that all foods are not the same. Some foods have fewer nutrients per serving than other foods, even though they might have the same number of calories or carbohydrates. It is difficult to get your body what it needs when you eat foods with fewer nutrients. Examples of foods that you should avoid that are high in calories and carbohydrates but low in nutrients include:  Trans fats (most processed foods list trans fats on the Nutrition Facts label).  Regular soda.  Juice.  Candy.  Sweets, such as cake, pie, doughnuts, and cookies.  Fried foods.  What foods can I eat? Eat nutrient-rich foods, which will nourish your body and keep you healthy. The food you should eat also will depend on several factors, including:  The calories you need.  The medicines you take.  Your weight.  Your blood glucose level.  Your blood pressure level.  Your cholesterol level.  You should eat a variety of foods, including:  Protein. ? Lean cuts of meat. ? Proteins low in saturated fats, such as fish, egg whites, and beans. Avoid processed meats.  Fruits and vegetables. ? Fruits and vegetables that may help control blood glucose levels, such as apples,   mangoes, and yams.  Dairy products. ? Choose fat-free or low-fat dairy products, such as milk, yogurt, and cheese.  Grains, bread, pasta, and rice. ? Choose whole grain products, such as multigrain bread, whole oats, and brown rice. These foods may help control blood pressure.  Fats. ? Foods containing healthful fats, such as  nuts, avocado, olive oil, canola oil, and fish.  Does everyone with diabetes mellitus have the same meal plan? Because every person with diabetes mellitus is different, there is not one meal plan that works for everyone. It is very important that you meet with a dietitian who will help you create a meal plan that is just right for you. This information is not intended to replace advice given to you by your health care provider. Make sure you discuss any questions you have with your health care provider. Document Released: 06/26/2005 Document Revised: 03/06/2016 Document Reviewed: 08/26/2013 Elsevier Interactive Patient Education  2017 Elsevier Inc.  

## 2017-10-07 ENCOUNTER — Other Ambulatory Visit: Payer: Self-pay | Admitting: Family Medicine

## 2017-10-15 ENCOUNTER — Other Ambulatory Visit: Payer: Self-pay | Admitting: Family Medicine

## 2017-10-28 DIAGNOSIS — I10 Essential (primary) hypertension: Secondary | ICD-10-CM | POA: Diagnosis not present

## 2017-10-29 ENCOUNTER — Encounter: Payer: Self-pay | Admitting: Family Medicine

## 2017-10-29 LAB — BASIC METABOLIC PANEL
BUN/Creatinine Ratio: 13 (ref 12–28)
BUN: 13 mg/dL (ref 8–27)
CHLORIDE: 99 mmol/L (ref 96–106)
CO2: 27 mmol/L (ref 20–29)
Calcium: 9.6 mg/dL (ref 8.7–10.3)
Creatinine, Ser: 1.03 mg/dL — ABNORMAL HIGH (ref 0.57–1.00)
GFR calc non Af Amer: 56 mL/min/{1.73_m2} — ABNORMAL LOW (ref >59)
GFR, EST AFRICAN AMERICAN: 65 mL/min/{1.73_m2} (ref >59)
GLUCOSE: 116 mg/dL — AB (ref 65–99)
POTASSIUM: 3.5 mmol/L (ref 3.5–5.2)
SODIUM: 140 mmol/L (ref 134–144)

## 2017-11-10 ENCOUNTER — Other Ambulatory Visit: Payer: Self-pay | Admitting: Family Medicine

## 2017-12-08 DIAGNOSIS — E119 Type 2 diabetes mellitus without complications: Secondary | ICD-10-CM | POA: Diagnosis not present

## 2017-12-08 DIAGNOSIS — B351 Tinea unguium: Secondary | ICD-10-CM | POA: Diagnosis not present

## 2018-01-10 ENCOUNTER — Other Ambulatory Visit: Payer: Self-pay | Admitting: Family Medicine

## 2018-01-11 ENCOUNTER — Ambulatory Visit: Payer: Medicare HMO | Admitting: Family Medicine

## 2018-01-11 ENCOUNTER — Encounter: Payer: Self-pay | Admitting: Family Medicine

## 2018-01-11 ENCOUNTER — Other Ambulatory Visit: Payer: Self-pay | Admitting: *Deleted

## 2018-01-11 VITALS — BP 128/80 | Temp 98.2°F | Ht 65.0 in | Wt 190.0 lb

## 2018-01-11 DIAGNOSIS — J019 Acute sinusitis, unspecified: Secondary | ICD-10-CM | POA: Diagnosis not present

## 2018-01-11 DIAGNOSIS — J301 Allergic rhinitis due to pollen: Secondary | ICD-10-CM

## 2018-01-11 DIAGNOSIS — Z79899 Other long term (current) drug therapy: Secondary | ICD-10-CM

## 2018-01-11 DIAGNOSIS — B9689 Other specified bacterial agents as the cause of diseases classified elsewhere: Secondary | ICD-10-CM

## 2018-01-11 DIAGNOSIS — E785 Hyperlipidemia, unspecified: Secondary | ICD-10-CM | POA: Diagnosis not present

## 2018-01-11 DIAGNOSIS — E118 Type 2 diabetes mellitus with unspecified complications: Secondary | ICD-10-CM

## 2018-01-11 NOTE — Progress Notes (Signed)
   Subjective:    Patient ID: Tracy Spencer, female    DOB: 01/20/1951, 67 y.o.   MRN: [id number]  Cough  This is a new problem. The current episode started in the past 7 days. The problem has been gradually worsening. The problem occurs constantly. Associated symptoms include myalgias and rhinorrhea. Pertinent negatives include no chest pain, ear pain, fever, shortness of breath or wheezing. Nothing aggravates the symptoms. She has tried nothing for the symptoms. The treatment provided moderate relief. Her past medical history is significant for environmental allergies. There is no history of asthma, bronchiectasis, COPD or pneumonia.    Patient is here today with complaints of a cough and mucus in throat and nose is thick,headache and fever on going since last Wednesday.States she is coughing her back in the center hurt.  Review of Systems  Constitutional: Negative for activity change and fever.  HENT: Positive for congestion and rhinorrhea. Negative for ear pain.   Eyes: Negative for discharge.  Respiratory: Positive for cough. Negative for shortness of breath and wheezing.   Cardiovascular: Negative for chest pain.  Musculoskeletal: Positive for myalgias.  Allergic/Immunologic: Positive for environmental allergies.       Objective:   Physical Exam  Constitutional: She appears well-developed.  HENT:  Head: Normocephalic.  Right Ear: External ear normal.  Left Ear: External ear normal.  Nose: Nose normal.  Mouth/Throat: Oropharynx is clear and moist. No oropharyngeal exudate.  Eyes: Right eye exhibits no discharge. Left eye exhibits no discharge.  Neck: Neck supple. No tracheal deviation present.  Cardiovascular: Normal rate and normal heart sounds.  No murmur heard. Pulmonary/Chest: Effort normal and breath sounds normal. She has no wheezes. She has no rales.  Lymphadenopathy:    She has no cervical adenopathy.  Skin: Skin is warm and dry.  Nursing note and vitals  reviewed.         Assessment & Plan:  Allergic rhinitis allergy medicines Flonase OTC  Patient was seen today for upper respiratory illness. It is felt that the patient is dealing with sinusitis. Antibiotics were prescribed today. Importance of compliance with medication was discussed. Symptoms should gradually resolve over the course of the next several days. If high fevers, progressive illness, difficulty breathing, worsening condition or failure for symptoms to improve over the next several days then the patient is to follow-up. If any emergent conditions the patient is to follow-up in the emergency department otherwise to follow-up in the office.  Patient to do lab work before her follow-up office visit

## 2018-01-25 ENCOUNTER — Other Ambulatory Visit: Payer: Self-pay | Admitting: Family Medicine

## 2018-01-25 DIAGNOSIS — Z1231 Encounter for screening mammogram for malignant neoplasm of breast: Secondary | ICD-10-CM

## 2018-01-26 ENCOUNTER — Other Ambulatory Visit: Payer: Self-pay | Admitting: Family Medicine

## 2018-02-14 ENCOUNTER — Other Ambulatory Visit: Payer: Self-pay | Admitting: Family Medicine

## 2018-02-15 DIAGNOSIS — R69 Illness, unspecified: Secondary | ICD-10-CM | POA: Diagnosis not present

## 2018-02-18 DIAGNOSIS — E118 Type 2 diabetes mellitus with unspecified complications: Secondary | ICD-10-CM | POA: Diagnosis not present

## 2018-02-18 DIAGNOSIS — E785 Hyperlipidemia, unspecified: Secondary | ICD-10-CM | POA: Diagnosis not present

## 2018-02-18 DIAGNOSIS — Z79899 Other long term (current) drug therapy: Secondary | ICD-10-CM | POA: Diagnosis not present

## 2018-02-19 LAB — HEMOGLOBIN A1C
ESTIMATED AVERAGE GLUCOSE: 126 mg/dL
HEMOGLOBIN A1C: 6 % — AB (ref 4.8–5.6)

## 2018-02-19 LAB — HEPATIC FUNCTION PANEL
ALT: 13 IU/L (ref 0–32)
AST: 19 IU/L (ref 0–40)
Albumin: 4.6 g/dL (ref 3.6–4.8)
Alkaline Phosphatase: 44 IU/L (ref 39–117)
Bilirubin Total: 0.3 mg/dL (ref 0.0–1.2)
Bilirubin, Direct: 0.12 mg/dL (ref 0.00–0.40)
Total Protein: 7.2 g/dL (ref 6.0–8.5)

## 2018-02-19 LAB — LIPID PANEL
CHOLESTEROL TOTAL: 151 mg/dL (ref 100–199)
Chol/HDL Ratio: 2.4 ratio (ref 0.0–4.4)
HDL: 63 mg/dL (ref >39)
LDL Calculated: 75 mg/dL (ref 0–99)
TRIGLYCERIDES: 67 mg/dL (ref 0–149)
VLDL CHOLESTEROL CAL: 13 mg/dL (ref 5–40)

## 2018-03-02 ENCOUNTER — Encounter: Payer: Self-pay | Admitting: Family Medicine

## 2018-03-02 ENCOUNTER — Ambulatory Visit: Payer: Medicare HMO | Admitting: Family Medicine

## 2018-03-02 VITALS — BP 110/80 | Ht 65.0 in | Wt 190.8 lb

## 2018-03-02 DIAGNOSIS — M816 Localized osteoporosis [Lequesne]: Secondary | ICD-10-CM | POA: Diagnosis not present

## 2018-03-02 DIAGNOSIS — E119 Type 2 diabetes mellitus without complications: Secondary | ICD-10-CM

## 2018-03-02 DIAGNOSIS — K5909 Other constipation: Secondary | ICD-10-CM

## 2018-03-02 DIAGNOSIS — E7849 Other hyperlipidemia: Secondary | ICD-10-CM | POA: Diagnosis not present

## 2018-03-02 DIAGNOSIS — I10 Essential (primary) hypertension: Secondary | ICD-10-CM

## 2018-03-02 LAB — HM DIABETES EYE EXAM

## 2018-03-02 MED ORDER — AMLODIPINE BESYLATE 5 MG PO TABS: ORAL_TABLET | ORAL | 1 refills | Status: DC

## 2018-03-02 MED ORDER — POTASSIUM CHLORIDE CRYS ER 20 MEQ PO TBCR: EXTENDED_RELEASE_TABLET | ORAL | 1 refills | Status: DC

## 2018-03-02 MED ORDER — GLIPIZIDE 5 MG PO TABS: ORAL_TABLET | ORAL | 5 refills | Status: DC

## 2018-03-02 MED ORDER — LOSARTAN POTASSIUM-HCTZ 100-25 MG PO TABS: 1.0000 | ORAL_TABLET | Freq: Every day | ORAL | 1 refills | Status: DC

## 2018-03-02 MED ORDER — ROSUVASTATIN CALCIUM 40 MG PO TABS: 40.0000 mg | ORAL_TABLET | Freq: Every day | ORAL | 1 refills | Status: DC

## 2018-03-02 NOTE — Progress Notes (Signed)
Subjective:    Patient ID: Tracy Spencer, female    DOB: Feb 23, 1951, 67 y.o.   MRN: [id number]  Diabetes  She presents for her follow-up diabetic visit. She has type 2 diabetes mellitus. Pertinent negatives for hypoglycemia include no confusion. Pertinent negatives for diabetes include no chest pain, no fatigue, no polydipsia, no polyphagia and no weakness. She is compliant with treatment all of the time. Eye exam is current (goes today).  A1C 6.0 on bloodwork. Patient states she is actually doing better with her diabetes doing a better job watching diet staying active she is trying to keep her weight down she relates compliance with her medicine  Patient for blood pressure check up. Patient relates compliance with meds. Todays BP reviewed with the patient. Patient denies issues with medication. Patient relates reasonable diet. Patient tries to minimize salt. Patient aware of BP goals.  Patient here for follow-up regarding cholesterol.  Patient does try to maintain a reasonable diet.  Patient does take the medication on a regular basis.  Denies missing a dose.  The patient denies any obvious side effects.  Prior blood work results reviewed with the patient.  The patient is aware of his cholesterol goals and the need to keep it under good control to lessen the risk of disease.    Constipation. Stopped taking metamucil because it made her bloated.  Denies blood in stool denies sweats chills fevers denies abdominal pain states Metamucil was making her feel bloated she is up-to-date on colonoscopy   Review of Systems  Constitutional: Negative for activity change, appetite change and fatigue.  HENT: Negative for congestion and rhinorrhea.   Respiratory: Negative for cough and shortness of breath.   Cardiovascular: Negative for chest pain and leg swelling.  Gastrointestinal: Positive for constipation. Negative for abdominal pain.  Endocrine: Negative for polydipsia and polyphagia.    Musculoskeletal: Negative for arthralgias and back pain.  Skin: Negative for color change.  Neurological: Negative for weakness.  Psychiatric/Behavioral: Negative for agitation and confusion.       Objective:   Physical Exam  Constitutional: She appears well-nourished. No distress.  HENT:  Head: Normocephalic and atraumatic.  Eyes: Right eye exhibits no discharge. Left eye exhibits no discharge.  Neck: No tracheal deviation present.  Cardiovascular: Normal rate, regular rhythm and normal heart sounds.  No murmur heard. Pulmonary/Chest: Effort normal and breath sounds normal. No respiratory distress.  Abdominal: Soft. She exhibits no distension. There is no tenderness. There is no guarding.  Musculoskeletal: She exhibits no edema.  Lymphadenopathy:    She has no cervical adenopathy.  Neurological: She is alert. She exhibits normal muscle tone.  Skin: Skin is warm and dry.  Psychiatric: She has a normal mood and affect. Her behavior is normal.  Vitals reviewed.         Assessment & Plan:  HTN decent control continue current medications watch diet walk on a regular basis to keep weight and check  Diabetes overall good control continue current measures.  Back down on glipizide to half tablet once daily.  Follow-up again in several months time if A1c still looking good we will stop glipizide  Hyperlipidemia continue current measures previous labs reviewed watch diet closely  Osteoporosis but on her FRAX score she does not meet the criteria for ongoing treatment therefore stop treatment will keep up with bone density every 2 years  Mild constipation recommend Benefiber hopefully this will help if it does not she needs to notify us we will help  set her up with gastroenterology

## 2018-03-10 ENCOUNTER — Ambulatory Visit (HOSPITAL_COMMUNITY)
Admission: RE | Admit: 2018-03-10 | Discharge: 2018-03-10 | Disposition: A | Discharge status: Home / Self Care | Payer: Medicare HMO | Source: Ambulatory Visit | Attending: Family Medicine | Admitting: Family Medicine

## 2018-03-10 DIAGNOSIS — Z1231 Encounter for screening mammogram for malignant neoplasm of breast: Secondary | ICD-10-CM

## 2018-03-15 ENCOUNTER — Encounter: Payer: Self-pay | Admitting: Obstetrics & Gynecology

## 2018-03-15 ENCOUNTER — Ambulatory Visit: Payer: Medicare HMO | Admitting: Obstetrics & Gynecology

## 2018-03-15 VITALS — BP 150/90 | HR 63 | Ht 64.0 in | Wt 193.0 lb

## 2018-03-15 DIAGNOSIS — B9689 Other specified bacterial agents as the cause of diseases classified elsewhere: Secondary | ICD-10-CM

## 2018-03-15 DIAGNOSIS — N760 Acute vaginitis: Secondary | ICD-10-CM

## 2018-03-15 MED ORDER — METRONIDAZOLE 0.75 % VA GEL: VAGINAL | 1 refills | Status: DC

## 2018-03-15 NOTE — Progress Notes (Signed)
       Chief Complaint  Patient presents with  . vaginal itching and odor    Blood pressure (!) 150/90, pulse 63, height 5\' 4"  (1.626 m), weight 193 lb (87.5 kg).  67 y.o. [id number] No LMP recorded. Patient has had a hysterectomy. The current method of family planning is status post hysterectomy.  Subjective Vaginal discharge for 2months Itching yes Irritation yes Odor yes Similar to previous no  Previous treatment   Objective Vulva:  normal appearing vulva with no masses, tenderness or lesions Vagina:  normal mucosa, thin grey discharge Cervix:  absent Uterus:  uterus absent Adnexa: ovaries:not present,       Pertinent ROS No burning with urination, frequency or urgency No nausea, vomiting or diarrhea Nor fever chills or other constitutional symptoms   Labs or studies Wet Prep:   A sample of vaginal discharge was obtained from the posterior fornix using a cotton swab. 2 drops of saline were placed on a slide and the cotton swab was immersed in the saline. Microscopic evaluation was performed and results were as follows:  Negative  for yeast  Positive for clue cells , consistent with Bacterial vaginosis Negative for trichomonas  Normal WBC population   Whiff test: Positive     Impression Diagnoses this Encounter::   ICD-10-CM   1. BV (bacterial vaginosis) N76.0    B96.89     Established relevant diagnosis(es):   Plan/Recommendations: No orders of the defined types were placed in this encounter.   Labs or Scans Ordered: No orders of the defined types were placed in this encounter.   Management:: metrogel x 5 days with 1 refill Recheck 1 month   Follow up Return in about 1 month (around 04/14/2018) for Follow up, with Dr Despina HiddenEure.    All questions were answered.

## 2018-04-12 ENCOUNTER — Encounter (INDEPENDENT_AMBULATORY_CARE_PROVIDER_SITE_OTHER): Payer: Self-pay

## 2018-04-12 ENCOUNTER — Ambulatory Visit: Payer: Medicare HMO | Admitting: Obstetrics & Gynecology

## 2018-04-12 ENCOUNTER — Encounter: Payer: Self-pay | Admitting: Obstetrics & Gynecology

## 2018-04-12 VITALS — BP 106/57 | HR 58 | Ht 64.0 in | Wt 193.5 lb

## 2018-04-12 DIAGNOSIS — B9689 Other specified bacterial agents as the cause of diseases classified elsewhere: Secondary | ICD-10-CM | POA: Diagnosis not present

## 2018-04-12 DIAGNOSIS — N760 Acute vaginitis: Secondary | ICD-10-CM | POA: Diagnosis not present

## 2018-04-12 DIAGNOSIS — R69 Illness, unspecified: Secondary | ICD-10-CM | POA: Diagnosis not present

## 2018-04-12 NOTE — Progress Notes (Signed)
       Chief Complaint  Patient presents with  . Follow-up    pt feels better!!!     Blood pressure (!) 106/57, pulse (!) 58, height 5\' 4"  (1.626 m), weight 193 lb 8 oz (87.8 kg).  67 y.o. [id number] No LMP recorded. Patient has had a hysterectomy. The current method of family planning is status post hysterectomy and post menopausal status.  Subjective Vaginal discharge for resolved s/p metrogel  Itching no Irritation no Odor no Similar to previous no  Previous treatment metro gel  Objective Vulva:  normal appearing vulva with no masses, tenderness or lesions Vagina:  normal mucosa, no discharge Cervix:  absent Uterus:  uterus absent Adnexa: ovariesnot :present,       Pertinent ROS No burning with urination, frequency or urgency No nausea, vomiting or diarrhea Nor fever chills or other constitutional symptoms   Labs or studies n/a       Impression Diagnoses this Encounter::   ICD-10-CM   1. BV (bacterial vaginosis) N76.0    B96.89    resolved    Established relevant diagnosis(es):   Plan/Recommendations: No orders of the defined types were placed in this encounter.   Labs or Scans Ordered: No orders of the defined types were placed in this encounter.   Management:: No further treatment or follow up I needed  Follow up Return if symptoms worsen or fail to improve.         All questions were answered.

## 2018-04-17 ENCOUNTER — Other Ambulatory Visit: Payer: Self-pay | Admitting: Family Medicine

## 2018-06-01 DIAGNOSIS — B351 Tinea unguium: Secondary | ICD-10-CM | POA: Diagnosis not present

## 2018-06-01 DIAGNOSIS — E119 Type 2 diabetes mellitus without complications: Secondary | ICD-10-CM | POA: Diagnosis not present

## 2018-06-07 DIAGNOSIS — R69 Illness, unspecified: Secondary | ICD-10-CM | POA: Diagnosis not present

## 2018-07-29 ENCOUNTER — Other Ambulatory Visit: Payer: Self-pay

## 2018-07-29 ENCOUNTER — Telehealth: Payer: Self-pay | Admitting: Family Medicine

## 2018-07-29 DIAGNOSIS — R69 Illness, unspecified: Secondary | ICD-10-CM | POA: Diagnosis not present

## 2018-07-29 MED ORDER — ONETOUCH DELICA LANCETS 33G MISC: 5 refills | Status: DC

## 2018-07-29 NOTE — Telephone Encounter (Signed)
Patient needs diabetic supplies sent to [facility] on [address] (next appt is 08-24-18) Tracy Spencer LANCETS 33G MISC

## 2018-07-29 NOTE — Telephone Encounter (Signed)
Pt notified on her voicemail that rx she requested was sent to pharm.

## 2018-07-29 NOTE — Telephone Encounter (Signed)
Lancets sent to [facility]. Left message to return call

## 2018-08-05 ENCOUNTER — Other Ambulatory Visit: Payer: Self-pay | Admitting: Family Medicine

## 2018-08-06 DIAGNOSIS — I10 Essential (primary) hypertension: Secondary | ICD-10-CM | POA: Diagnosis not present

## 2018-08-06 DIAGNOSIS — E7849 Other hyperlipidemia: Secondary | ICD-10-CM | POA: Diagnosis not present

## 2018-08-06 DIAGNOSIS — E119 Type 2 diabetes mellitus without complications: Secondary | ICD-10-CM | POA: Diagnosis not present

## 2018-08-06 DIAGNOSIS — M816 Localized osteoporosis [Lequesne]: Secondary | ICD-10-CM | POA: Diagnosis not present

## 2018-08-07 ENCOUNTER — Encounter: Payer: Self-pay | Admitting: Family Medicine

## 2018-08-07 LAB — LIPID PANEL
Chol/HDL Ratio: 2.7 ratio (ref 0.0–4.4)
Cholesterol, Total: 158 mg/dL (ref 100–199)
HDL: 59 mg/dL (ref >39)
LDL Calculated: 83 mg/dL (ref 0–99)
Triglycerides: 80 mg/dL (ref 0–149)
VLDL CHOLESTEROL CAL: 16 mg/dL (ref 5–40)

## 2018-08-07 LAB — HEPATIC FUNCTION PANEL
ALT: 18 IU/L (ref 0–32)
AST: 23 IU/L (ref 0–40)
Albumin: 4.5 g/dL (ref 3.6–4.8)
Alkaline Phosphatase: 54 IU/L (ref 39–117)
BILIRUBIN TOTAL: 0.3 mg/dL (ref 0.0–1.2)
Bilirubin, Direct: 0.09 mg/dL (ref 0.00–0.40)
Total Protein: 7 g/dL (ref 6.0–8.5)

## 2018-08-07 LAB — HEMOGLOBIN A1C
Est. average glucose Bld gHb Est-mCnc: 143 mg/dL
Hgb A1c MFr Bld: 6.6 % — ABNORMAL HIGH (ref 4.8–5.6)

## 2018-08-07 LAB — BASIC METABOLIC PANEL
BUN/Creatinine Ratio: 12 (ref 12–28)
BUN: 12 mg/dL (ref 8–27)
CALCIUM: 9.8 mg/dL (ref 8.7–10.3)
CHLORIDE: 98 mmol/L (ref 96–106)
CO2: 26 mmol/L (ref 20–29)
Creatinine, Ser: 1.02 mg/dL — ABNORMAL HIGH (ref 0.57–1.00)
GFR calc non Af Amer: 56 mL/min/{1.73_m2} — ABNORMAL LOW (ref >59)
GFR, EST AFRICAN AMERICAN: 65 mL/min/{1.73_m2} (ref >59)
GLUCOSE: 122 mg/dL — AB (ref 65–99)
POTASSIUM: 3.5 mmol/L (ref 3.5–5.2)
Sodium: 141 mmol/L (ref 134–144)

## 2018-08-07 LAB — MICROALBUMIN / CREATININE URINE RATIO
Creatinine, Urine: 73.4 mg/dL
MICROALB/CREAT RATIO: 103.4 mg/g{creat} — AB (ref 0.0–30.0)
Microalbumin, Urine: 75.9 ug/mL

## 2018-08-11 ENCOUNTER — Telehealth: Payer: Self-pay | Admitting: Family Medicine

## 2018-08-11 NOTE — Telephone Encounter (Signed)
Patient stated she takes zyrtec and nose spary but she gets to coughiuing and it get hung up in the throat and she feels sense of panic- can't swallow the saliva and coughs to the point of wetting herself at times. Patient states it has been going on since 07/29/18. No fever or wheezing. please advise

## 2018-08-11 NOTE — Telephone Encounter (Signed)
Patient is requesting something for her allergies.She states the drainage makes her start coughing so bad it takes her breathe. [facility]

## 2018-08-11 NOTE — Telephone Encounter (Signed)
I called and left a message to r/c. 

## 2018-08-11 NOTE — Telephone Encounter (Signed)
If the patient is currently taking Zyrtec she has a couple options Option #1 change Zyrtec to fexofenadine OTC 180 mg 1 daily  Second option is adding Flonase 2 sprays each nostril daily, 1 canister with 4 refills  Certainly if signs of infection occur over the next week or so follow-up

## 2018-08-12 NOTE — Telephone Encounter (Signed)
Left message to return call 

## 2018-08-12 NOTE — Telephone Encounter (Signed)
Discussed with pt. Pt verbalized understanding.  °

## 2018-08-24 ENCOUNTER — Ambulatory Visit: Payer: Medicare HMO | Admitting: Family Medicine

## 2018-08-24 ENCOUNTER — Encounter: Payer: Self-pay | Admitting: Family Medicine

## 2018-08-24 VITALS — BP 134/82 | Ht 65.0 in | Wt 194.0 lb

## 2018-08-24 DIAGNOSIS — E7849 Other hyperlipidemia: Secondary | ICD-10-CM | POA: Diagnosis not present

## 2018-08-24 DIAGNOSIS — I10 Essential (primary) hypertension: Secondary | ICD-10-CM | POA: Diagnosis not present

## 2018-08-24 DIAGNOSIS — E119 Type 2 diabetes mellitus without complications: Secondary | ICD-10-CM

## 2018-08-24 MED ORDER — LOSARTAN POTASSIUM-HCTZ 100-25 MG PO TABS: 1.0000 | ORAL_TABLET | Freq: Every day | ORAL | 1 refills | Status: DC

## 2018-08-24 MED ORDER — GLIPIZIDE 5 MG PO TABS: ORAL_TABLET | ORAL | 5 refills | Status: DC

## 2018-08-24 MED ORDER — AMLODIPINE BESYLATE 5 MG PO TABS: ORAL_TABLET | ORAL | 1 refills | Status: DC

## 2018-08-24 MED ORDER — POTASSIUM CHLORIDE CRYS ER 20 MEQ PO TBCR
EXTENDED_RELEASE_TABLET | ORAL | 1 refills | Status: DC
Start: 2018-08-24 — End: 2018-12-29

## 2018-08-24 MED ORDER — ZOSTER VAC RECOMB ADJUVANTED 50 MCG/0.5ML IM SUSR: 0.5000 mL | Freq: Once | INTRAMUSCULAR | 1 refills | Status: AC

## 2018-08-24 MED ORDER — METFORMIN HCL 500 MG PO TABS: ORAL_TABLET | ORAL | 1 refills | Status: DC

## 2018-08-24 MED ORDER — ROSUVASTATIN CALCIUM 40 MG PO TABS: 40.0000 mg | ORAL_TABLET | Freq: Every day | ORAL | 1 refills | Status: DC

## 2018-08-24 NOTE — Patient Instructions (Signed)

## 2018-08-24 NOTE — Progress Notes (Signed)
Subjective:    Patient ID: Tracy Spencer, female    DOB: [birth year], 67 y.o.   MRN: [id number]  HPI Patient is here today to follow up on her chronic illnesses. She is diabetic and her latest A1c was drawn 08/06/2018 at 6.6.She is taking Metformin 500 one po Bid,Glipizide 5mg  1/2 am.She eats healthy and gets some exercise and she does not see any specialist.She has had her flu shot at [facility].  Results for orders placed or performed in visit on 03/02/18  [facility] DIABETES EYE EXAM  Result Value Ref Range   [facility] Diabetic Eye Exam  No Retinopathy   Patient for blood pressure check up.  The patient does have hypertension.  The patient is on medication.  Patient relates compliance with meds. Todays BP reviewed with the patient. Patient denies issues with medication. Patient relates reasonable diet. Patient tries to minimize salt. Patient aware of BP goals.  Patient here for follow-up regarding cholesterol.  The patient does have hyperlipidemia.  Patient does try to maintain a reasonable diet.  Patient does take the medication on a regular basis.  Denies missing a dose.  The patient denies any obvious side effects.  Prior blood work results reviewed with the patient.  The patient is aware of his cholesterol goals and the need to keep it under good control to lessen the risk of disease.  The patient was seen today as part of a comprehensive diabetic check up.the patient does have diabetes.  The patient follows here on a regular basis.  The patient relates medication compliance. No significant side effects to the medications. Denies any low glucose spells. Relates compliance with diet to a reasonable level. Patient does do labwork intermittently and understands the dangers of diabetes.    Review of Systems  Constitutional: Negative for activity change, appetite change and fatigue.  HENT: Negative for congestion and rhinorrhea.   Respiratory: Negative for cough and shortness of breath.     Cardiovascular: Negative for chest pain and leg swelling.  Gastrointestinal: Negative for abdominal pain and diarrhea.  Endocrine: Negative for polydipsia and polyphagia.  Skin: Negative for color change.  Neurological: Negative for dizziness and weakness.  Psychiatric/Behavioral: Negative for behavioral problems and confusion.       Objective:   Physical Exam  Constitutional: She appears well-nourished. No distress.  HENT:  Head: Normocephalic and atraumatic.  Eyes: Right eye exhibits no discharge. Left eye exhibits no discharge.  Neck: No tracheal deviation present.  Cardiovascular: Normal rate, regular rhythm and normal heart sounds.  No murmur heard. Pulmonary/Chest: Effort normal and breath sounds normal. No respiratory distress.  Musculoskeletal: She exhibits no edema.  Lymphadenopathy:    She has no cervical adenopathy.  Neurological: She is alert. Coordination normal.  Skin: Skin is warm and dry.  Psychiatric: She has a normal mood and affect. Her behavior is normal.  Vitals reviewed.   Lab work was reviewed with patient in detail      Assessment & Plan:  Tracy Spencer recommended The patient was seen today as part of a comprehensive visit for diabetes. The importance of keeping her A1c at or below 7 was discussed.  Importance of regular physical activity was discussed.   The importance of adherence to medication as well as a controlled low starch/sugar diet was also discussed.  Standard follow-up visit recommended.  Also patient aware failure to keep diabetes under control increases the risk of complications.  The patient was seen today as part of an evaluation  regarding hyperlipidemia.  Recent lab work has been reviewed with the patient as well as the goals for good cholesterol care.  In addition to this medications have been discussed the importance of compliance with diet and medications discussed as well.  Finally the patient is aware that poor control of  cholesterol, noncompliance can dramatically increase the risk of complications. The patient will keep regular office visits and the patient does agreed to periodic lab work.  The patient was seen today as part of a comprehensive visit for diabetes. The importance of keeping her A1c at or below 7 was discussed.  Importance of regular physical activity was discussed.   The importance of adherence to medication as well as a controlled low starch/sugar diet was also discussed.  Standard follow-up visit recommended.  Also patient aware failure to keep diabetes under control increases the risk of complications.  Patient gets her wellness through her gynecologist  25 minutes was spent with the patient.  This statement verifies that 25 minutes was indeed spent with the patient.  More than 50% of this visit-total duration of the visit-was spent in counseling and coordination of care. The issues that the patient came in for today as reflected in the diagnosis (s) please refer to documentation for further details.

## 2018-09-02 ENCOUNTER — Ambulatory Visit: Payer: Medicare HMO | Admitting: Family Medicine

## 2018-10-17 ENCOUNTER — Other Ambulatory Visit: Payer: Self-pay | Admitting: Family Medicine

## 2018-10-18 DIAGNOSIS — R002 Palpitations: Secondary | ICD-10-CM | POA: Diagnosis not present

## 2018-10-22 DIAGNOSIS — R69 Illness, unspecified: Secondary | ICD-10-CM | POA: Diagnosis not present

## 2018-11-09 ENCOUNTER — Ambulatory Visit: Payer: Medicare HMO | Admitting: Family Medicine

## 2018-11-09 ENCOUNTER — Encounter: Payer: Self-pay | Admitting: Family Medicine

## 2018-11-09 VITALS — BP 128/78 | Temp 97.6°F | Ht 65.0 in | Wt 195.6 lb

## 2018-11-09 DIAGNOSIS — N289 Disorder of kidney and ureter, unspecified: Secondary | ICD-10-CM

## 2018-11-09 DIAGNOSIS — R002 Palpitations: Secondary | ICD-10-CM

## 2018-11-09 NOTE — Patient Instructions (Signed)
Palpitations  Palpitations are feelings that your heartbeat is not normal. Your heartbeat may feel like it is:   Uneven.   Faster than normal.   Fluttering.   Skipping a beat.  This is usually not a serious problem. In some cases, you may need tests to rule out any serious problems.  Follow these instructions at home:  Pay attention to any changes in your condition. Take these actions to help manage your symptoms:  Eating and drinking   Avoid:  ? Coffee, tea, soft drinks, and energy drinks.  ? Chocolate.  ? Alcohol.  ? Diet pills.  Lifestyle     Try to lower your stress. These things can help you relax:  ? Yoga.  ? Deep breathing and meditation.  ? Exercise.  ? Using words and images to create positive thoughts (guided imagery).  ? Using your mind to control things in your body (biofeedback).   Do not use drugs.   Get plenty of rest and sleep. Keep a regular bed time.  General instructions     Take over-the-counter and prescription medicines only as told by your doctor.   Do not use any products that contain nicotine or tobacco, such as cigarettes and e-cigarettes. If you need help quitting, ask your doctor.   Keep all follow-up visits as told by your doctor. This is important. You may need more tests if palpitations do not go away or get worse.  Contact a doctor if:   Your symptoms last more than 24 hours.   Your symptoms occur more often.  Get help right away if you:   Have chest pain.   Feel short of breath.   Have a very bad headache.   Feel dizzy.   Pass out (faint).  Summary   Palpitations are feelings that your heartbeat is uneven or faster than normal. It may feel like your heart is fluttering or skipping a beat.   Avoid food and drinks that may cause palpitations. These include caffeine, chocolate, and alcohol.   Try to lower your stress. Do not smoke or use drugs.   Get help right away if you faint or have chest pain, shortness of breath, a severe headache, or dizziness.  This  information is not intended to replace advice given to you by your health care provider. Make sure you discuss any questions you have with your health care provider.  Document Released: 07/08/2008 Document Revised: 11/11/2017 Document Reviewed: 11/11/2017  Elsevier Interactive Patient Education  2019 [employer]

## 2018-11-09 NOTE — Progress Notes (Signed)
   Subjective:    Patient ID: Tracy Spencer, female    DOB: May 13, 1951, 68 y.o.   MRN: [id number]  Palpitations   This is a new problem. Episode onset: dec 24th. Pertinent negatives include no chest pain, coughing, dizziness, fever, nausea or shortness of breath. Associated symptoms comments: [facility] to left of chest. Treatments tried: cutting out coffee.   Denies excessive caffeine denies excessive stress having intermittent spells where there is palpitations and irregularity to her heart last sometimes a few seconds to 30 seconds sometimes several times per day   Review of Systems  Constitutional: Negative for activity change, fatigue and fever.  HENT: Negative for congestion and rhinorrhea.   Respiratory: Negative for cough, chest tightness and shortness of breath.   Cardiovascular: Positive for palpitations. Negative for chest pain and leg swelling.  Gastrointestinal: Negative for abdominal pain and nausea.  Skin: Negative for color change.  Neurological: Negative for dizziness and headaches.  Psychiatric/Behavioral: Negative for agitation and behavioral problems.       Objective:   Physical Exam Vitals signs reviewed.  Constitutional:      General: She is not in acute distress. HENT:     Head: Normocephalic and atraumatic.  Eyes:     General:        Right eye: No discharge.        Left eye: No discharge.  Neck:     Trachea: No tracheal deviation.  Cardiovascular:     Rate and Rhythm: Normal rate and regular rhythm.     Heart sounds: Normal heart sounds. No murmur.  Pulmonary:     Effort: Pulmonary effort is normal. No respiratory distress.     Breath sounds: Normal breath sounds.  Lymphadenopathy:     Cervical: No cervical adenopathy.  Skin:    General: Skin is warm and dry.  Neurological:     Mental Status: She is alert.     Coordination: Coordination normal.  Psychiatric:        Behavior: Behavior normal.   Normal EKG EKG no acute changes  compared to 2017 in July       Assessment & Plan:  Palpitations Hold off on medications currently Echo order Cardiology consult If ongoing troubles next step for them will be telemetry Lab work ordered await results  Patient to follow-up on other health issues later in the spring

## 2018-11-10 LAB — BASIC METABOLIC PANEL
BUN/Creatinine Ratio: 13 (ref 12–28)
BUN: 15 mg/dL (ref 8–27)
CALCIUM: 10.7 mg/dL — AB (ref 8.7–10.3)
CO2: 24 mmol/L (ref 20–29)
CREATININE: 1.17 mg/dL — AB (ref 0.57–1.00)
Chloride: 99 mmol/L (ref 96–106)
GFR calc Af Amer: 55 mL/min/{1.73_m2} — ABNORMAL LOW (ref >59)
GFR calc non Af Amer: 48 mL/min/{1.73_m2} — ABNORMAL LOW (ref >59)
GLUCOSE: 68 mg/dL (ref 65–99)
Potassium: 3.9 mmol/L (ref 3.5–5.2)
Sodium: 140 mmol/L (ref 134–144)

## 2018-11-10 LAB — TSH: TSH: 1.32 u[IU]/mL (ref 0.450–4.500)

## 2018-11-11 ENCOUNTER — Encounter: Payer: Self-pay | Admitting: Family Medicine

## 2018-11-11 NOTE — Addendum Note (Signed)
Addended by: Meredith Leeds on: 11/11/2018 10:52 AM   Modules accepted: Orders

## 2018-11-15 ENCOUNTER — Encounter: Payer: Self-pay | Admitting: Family Medicine

## 2018-11-16 ENCOUNTER — Ambulatory Visit (HOSPITAL_COMMUNITY)
Admission: RE | Admit: 2018-11-16 | Discharge: 2018-11-16 | Disposition: A | Discharge status: Home / Self Care | Payer: Medicare HMO | Source: Ambulatory Visit | Attending: Family Medicine | Admitting: Family Medicine

## 2018-11-16 DIAGNOSIS — I10 Essential (primary) hypertension: Secondary | ICD-10-CM | POA: Diagnosis not present

## 2018-11-16 DIAGNOSIS — E119 Type 2 diabetes mellitus without complications: Secondary | ICD-10-CM | POA: Insufficient documentation

## 2018-11-16 DIAGNOSIS — E785 Hyperlipidemia, unspecified: Secondary | ICD-10-CM | POA: Insufficient documentation

## 2018-11-16 DIAGNOSIS — R002 Palpitations: Secondary | ICD-10-CM | POA: Diagnosis not present

## 2018-11-16 NOTE — Progress Notes (Signed)
*  PRELIMINARY RESULTS* Echocardiogram 2D Echocardiogram has been performed.  Tracy Spencer 11/16/2018, 10:29 AM

## 2018-11-18 ENCOUNTER — Encounter: Payer: Self-pay | Admitting: Family Medicine

## 2018-11-25 NOTE — Progress Notes (Signed)
Cardiology Office Note   Date:  11/25/2018   ID:  Tracy Spencer, DOB [birth year], MRN [id number]  PCP:  Babs SciaraLuking, Scott A, MD  Cardiologist:   Charlton HawsPeter Nishan, MD   No chief complaint on file.     History of Present Illness: Tracy Spencer is a 68 y.o. female who presents for consultation regarding palpitations. Referred by Dr Gerda DissLuking She has history of DM, HLT and HTN Complained to primary of palpitations 11/09/18  She has no dyspnea Has atypical sharp pin pricks in her chest at rest. Palpitations improved since December not getting them all the time Maybe 2-3 times / month Usually when she's rushing around or worried about her grand kids   TTE reviewed from 11/16/18 totally normal with EF 55-60%    Past Medical History:  Diagnosis Date  . Allergic rhinitis   . Diabetes mellitus without complication (HCC)   . Eczema   . Hypertension   . Prediabetes     Past Surgical History:  Procedure Laterality Date  . ABDOMINAL HYSTERECTOMY    . CATARACT EXTRACTION W/PHACO Right 04/21/2016   Procedure: CATARACT EXTRACTION PHACO AND INTRAOCULAR LENS PLACEMENT (IOC);  Surgeon: Gemma PayorKerry Hunt, MD;  Location: [facility];  Service: Ophthalmology;  Laterality: Right;  CDE:10.80  . COLONOSCOPY    . COLONOSCOPY N/A 02/07/2015   Procedure: COLONOSCOPY;  Surgeon: Corbin Adeobert M Rourk, MD;  Location: [facility];  Service: Endoscopy;  Laterality: N/A;  8:30 AM  . TUBAL LIGATION       Current Outpatient Medications  Medication Sig Dispense Refill  . amLODipine (NORVASC) 5 MG tablet TAKE 1 TABLET(5 MG) BY MOUTH DAILY 90 tablet 1  . cetirizine (ZYRTEC) 10 MG tablet Take 1 tablet (10 mg total) by mouth daily. (Patient taking differently: Take 10 mg by mouth as needed. ) 90 tablet 1  . Ferrous Sulfate Dried (FERROUS SULFATE CR PO) Take by mouth daily.     . fish oil-omega-3 fatty acids 1000 MG capsule Take 1 g by mouth daily.     Marland Kitchen. glipiZIDE (GLUCOTROL) 5 MG tablet TAKE ONE-HALF TABLET BY MOUTH IN THE MORNING 45  tablet 5  . losartan-hydrochlorothiazide (HYZAAR) 100-25 MG tablet TAKE 1 TABLET BY MOUTH EVERY DAY 90 tablet 1  . metFORMIN (GLUCOPHAGE) 500 MG tablet TAKE 1 TABLET(500 MG) BY MOUTH TWICE DAILY WITH A MEAL 180 tablet 1  . ONE TOUCH ULTRA TEST test strip USE TO TEST BLOOD SUGAR DAILY 100 each 5  . ONETOUCH DELICA LANCETS 33G MISC USE TO TEST BLOOD SUGAR EVERY DAY 100 each 5  . potassium chloride SA (K-DUR,KLOR-CON) 20 MEQ tablet TAKE 1 TABLET(20 MEQ) BY MOUTH TWICE DAILY 180 tablet 1  . rosuvastatin (CRESTOR) 40 MG tablet Take 1 tablet (40 mg total) by mouth daily. 90 tablet 1   No current facility-administered medications for this visit.     Allergies:   Naproxen; Fosamax [alendronate sodium]; and Lisinopril    Social History:  The patient  reports that she has never smoked. She has never used smokeless tobacco. She reports current alcohol use. She reports that she does not use drugs.   Family History:  The patient's family history includes Cancer in her brother and maternal grandfather; Crohn's disease in her son; Heart attack in her maternal grandmother and sister; Hypertension in her father and mother.    ROS:  Please see the history of present illness.   Otherwise, review of systems are positive for none.   All other  systems are reviewed and negative.    PHYSICAL EXAM: VS:  There were no vitals taken for this visit. , BMI There is no height or weight on file to calculate BMI. Affect appropriate Healthy:  appears stated age HEENT: normal Neck supple with no adenopathy JVP normal no bruits no thyromegaly Lungs clear with no wheezing and good diaphragmatic motion Heart:  S1/S2 no murmur, no rub, gallop or click PMI normal Abdomen: benighn, BS positve, no tenderness, no AAA no bruit.  No HSM or HJR Distal pulses intact with no bruits No edema Neuro non-focal Skin warm and dry No muscular weakness    EKG:  SR poor R wave progression otherwise normal    Recent  Labs: 08/06/2018: ALT 18 11/09/2018: BUN 15; Creatinine, Ser 1.17; Potassium 3.9; Sodium 140; TSH 1.320    Lipid Panel    Component Value Date/Time   CHOL 158 08/06/2018 0827   TRIG 80 08/06/2018 0827   HDL 59 08/06/2018 0827   CHOLHDL 2.7 08/06/2018 0827   CHOLHDL 2.6 07/07/2014 0747   VLDL 14 07/07/2014 0747   LDLCALC 83 08/06/2018 0827      Wt Readings from Last 3 Encounters:  11/09/18 195 lb 9.6 oz (88.7 kg)  08/24/18 194 lb 0.6 oz (88 kg)  04/12/18 193 lb 8 oz (87.8 kg)      Other studies Reviewed: Additional studies/ records that were reviewed today include: notes from primary labs and ECG and TTE .    ASSESSMENT AND PLAN:  1.  Palpitations:  Benign sounding in setting of normal ECG, exam and TTE No testing currently indicated She will call us if frequency increases and we can order a 30 day event monitor at that time  2. HTN: Well controlled.  Continue current medications and low sodium Dash type diet.   3. DM:  Discussed low carb diet.  Target hemoglobin A1c is 6.5 or less.  Continue current medications. 4. HLD:  Continue statin labs with primary    Current medicines are reviewed at length with the patient today.  The patient does not have concerns regarding medicines.  The following changes have been made:  no change  Labs/ tests ordered today include: None  No orders of the defined types were placed in this encounter.    Disposition:   FU with cardiology PRN      Signed, Charlton Haws, MD  11/25/2018 9:28 AM    [facility] [address] [place], [place], [place]  [address] Phone: [phone]; Fax: [phone]

## 2018-11-29 ENCOUNTER — Encounter: Payer: Self-pay | Admitting: Cardiovascular Disease

## 2018-11-29 ENCOUNTER — Ambulatory Visit: Payer: Medicare HMO | Admitting: Cardiovascular Disease

## 2018-11-29 VITALS — BP 152/100 | HR 72 | Ht 65.0 in | Wt 193.0 lb

## 2018-11-29 DIAGNOSIS — R002 Palpitations: Secondary | ICD-10-CM | POA: Diagnosis not present

## 2018-11-29 NOTE — Patient Instructions (Signed)
Medication Instructions:  Your physician recommends that you continue on your current medications as directed. Please refer to the Current Medication list given to you today.  If you need a refill on your cardiac medications before your next appointment, please call your pharmacy.   Lab work: NONE  If you have labs (blood work) drawn today and your tests are completely normal, you will receive your results only by: Marland Kitchen MyChart Message (if you have MyChart) OR . A paper copy in the mail If you have any lab test that is abnormal or we need to change your treatment, we will call you to review the results.  Testing/Procedures: NONE   Follow-Up: At [facility], you and your health needs are our priority.  As part of our continuing mission to provide you with exceptional heart care, we have created designated Provider Care Teams.  These Care Teams include your primary Cardiologist (physician) and Advanced Practice Providers (APPs -  Physician Assistants and Nurse Practitioners) who all work together to provide you with the care you need, when you need it. You will need a follow up appointment as needed with Dr. Eden Emms .  Please call our office 2 months in advance to schedule this appointment.  You may see No primary care provider on file. or one of the following Advanced Practice Providers on your designated Care Team:   [place], PA-C [facility]) . Jacolyn Reedy, PA-C [facility] Office)  Any Other Special Instructions Will Be Listed Below (If Applicable). Thank you for choosing [facility]!

## 2018-11-30 DIAGNOSIS — E119 Type 2 diabetes mellitus without complications: Secondary | ICD-10-CM | POA: Diagnosis not present

## 2018-11-30 DIAGNOSIS — B351 Tinea unguium: Secondary | ICD-10-CM | POA: Diagnosis not present

## 2018-12-09 ENCOUNTER — Telehealth: Payer: Self-pay | Admitting: Family Medicine

## 2018-12-09 DIAGNOSIS — Z780 Asymptomatic menopausal state: Secondary | ICD-10-CM

## 2018-12-09 DIAGNOSIS — M810 Age-related osteoporosis without current pathological fracture: Secondary | ICD-10-CM

## 2018-12-09 NOTE — Telephone Encounter (Signed)
Nurses Please communicate with the patient Please let the patient know that in May she is due for a bone density test Please go ahead and initiate the order to do the bone density test in early May hopefully results will be back by the time she does her appointment in mid May thank you

## 2018-12-10 NOTE — Telephone Encounter (Signed)
Left message to return call to see when pt can go.  Bone density order put in. Just need to schedule and notify pt.

## 2018-12-10 NOTE — Addendum Note (Signed)
Addended by: Metro Kung on: 12/10/2018 02:17 PM   Modules accepted: Orders

## 2018-12-10 NOTE — Telephone Encounter (Signed)
Patient stated she can go any day in May but prefers am

## 2018-12-20 NOTE — Telephone Encounter (Signed)
Bone density scheduled for Mar 09 2019 at 8:15 am. No calcium supplements 24-48 hours before test, comfortable 2 piece clothing and complete med list needed. Left message to return call

## 2018-12-21 NOTE — Telephone Encounter (Signed)
Left message to return call 

## 2018-12-22 NOTE — Telephone Encounter (Signed)
Pt returned call and verbalized understanding  

## 2018-12-22 NOTE — Telephone Encounter (Signed)
Left message to return call 

## 2018-12-29 ENCOUNTER — Other Ambulatory Visit: Payer: Self-pay | Admitting: Family Medicine

## 2019-01-05 ENCOUNTER — Other Ambulatory Visit: Payer: Self-pay | Admitting: Family Medicine

## 2019-02-22 ENCOUNTER — Other Ambulatory Visit: Payer: Self-pay

## 2019-02-22 ENCOUNTER — Ambulatory Visit (INDEPENDENT_AMBULATORY_CARE_PROVIDER_SITE_OTHER): Payer: Medicare HMO | Admitting: Family Medicine

## 2019-02-22 DIAGNOSIS — I10 Essential (primary) hypertension: Secondary | ICD-10-CM

## 2019-02-22 DIAGNOSIS — E119 Type 2 diabetes mellitus without complications: Secondary | ICD-10-CM | POA: Diagnosis not present

## 2019-02-22 DIAGNOSIS — E7849 Other hyperlipidemia: Secondary | ICD-10-CM | POA: Diagnosis not present

## 2019-02-22 NOTE — Progress Notes (Signed)
   Subjective:    Patient ID: Tracy Spencer, female    DOB: May 05, 1951, 68 y.o.   MRN: [id number]  HPI    Review of Systems     Objective:   Physical Exam        Assessment & Plan:

## 2019-02-22 NOTE — Addendum Note (Signed)
Addended by: Marlowe Shores on: 02/22/2019 11:56 AM   Modules accepted: Orders

## 2019-02-22 NOTE — Progress Notes (Signed)
Subjective:    Patient ID: Tracy Spencer, female    DOB: 17-Sep-1951, 68 y.o.   MRN: [id number]  Diabetes  She presents for her follow-up diabetic visit. She has type 2 diabetes mellitus. Pertinent negatives for hypoglycemia include no confusion or dizziness. Pertinent negatives for diabetes include no chest pain, no fatigue, no polydipsia, no polyphagia and no weakness. Current diabetic treatments: metformin, glipizide. She is compliant with treatment all of the time. Exercise: walks at least 3 days a week. Home blood sugar record trend: 150 -160 in the morning  Eye exam current: was suppose to go this month.  Patient denies any low sugar spells trying to eat healthy trying to stay physically active She believes her blood pressure under good control denies any headaches or chest tightness pressure pain Occasionally gets reflux with her eating but is trying to eat healthy She is taking her cholesterol medicine previous labs reviewed new labs will be ordered but not currently Pt states no concerns today.   Virtual Visit via Video Note  I connected with Tracy Spencer on 02/22/19 at  9:00 AM EDT by a video enabled telemedicine application and verified that I am speaking with the correct person using two identifiers.  Location: Patient: home Provider: office   I discussed the limitations of evaluation and management by telemedicine and the availability of in person appointments. The patient expressed understanding and agreed to proceed.  History of Present Illness:    Observations/Objective:   Assessment and Plan:   Follow Up Instructions:    I discussed the assessment and treatment plan with the patient. The patient was provided an opportunity to ask questions and all were answered. The patient agreed with the plan and demonstrated an understanding of the instructions.   The patient was advised to call back or seek an in-person evaluation if the symptoms worsen or if the  condition fails to improve as anticipated.  I provided 15 minutes of non-face-to-face time during this encounter.      Review of Systems  Constitutional: Negative for activity change, appetite change and fatigue.  HENT: Negative for congestion and rhinorrhea.   Respiratory: Negative for cough and shortness of breath.   Cardiovascular: Negative for chest pain and leg swelling.  Gastrointestinal: Negative for abdominal pain and diarrhea.  Endocrine: Negative for polydipsia and polyphagia.  Skin: Negative for color change.  Neurological: Negative for dizziness and weakness.  Psychiatric/Behavioral: Negative for behavioral problems and confusion.       Objective:   Physical Exam  Unable to do physical exam via video but patient did appear to be well with normal respiratory rate no major setbacks      Assessment & Plan:  The patient was seen today as part of a comprehensive visit for diabetes. The importance of keeping her A1c at or below 7 was discussed.  Importance of regular physical activity was discussed.   The importance of adherence to medication as well as a controlled low starch/sugar diet was also discussed.  Standard follow-up visit recommended.  Also patient aware failure to keep diabetes under control increases the risk of complications.  HTN- Patient was seen today as part of a visit regarding hypertension. The importance of healthy diet and regular physical activity was discussed. The importance of compliance with medications discussed.  Ideal goal is to keep blood pressure low elevated levels certainly below 140/90 when possible.  The patient was counseled that keeping blood pressure under control lessen his risk of complications.  The importance of regular follow-ups was discussed with the patient.  Low-salt diet such as DASH recommended.  Regular physical activity was recommended as well.  Patient was advised to keep regular follow-ups.  The patient was seen  today as part of an evaluation regarding hyperlipidemia.  Recent lab work has been reviewed with the patient as well as the goals for good cholesterol care.  In addition to this medications have been discussed the importance of compliance with diet and medications discussed as well.  Finally the patient is aware that poor control of cholesterol, noncompliance can dramatically increase the risk of complications. The patient will keep regular office visits and the patient does agreed to periodic lab work.  Patient will do lab work around September follow-up in October Patient will be doing her eye exam later this year She also relates that she will hold off on bone density testing for now

## 2019-02-22 NOTE — Progress Notes (Signed)
Lab orders placed and mailed to patient  

## 2019-03-08 ENCOUNTER — Other Ambulatory Visit (HOSPITAL_COMMUNITY): Payer: Medicare HMO

## 2019-03-08 DIAGNOSIS — E119 Type 2 diabetes mellitus without complications: Secondary | ICD-10-CM | POA: Diagnosis not present

## 2019-03-09 ENCOUNTER — Inpatient Hospital Stay (HOSPITAL_COMMUNITY): Admission: RE | Admit: 2019-03-09 | Payer: Medicare HMO | Source: Ambulatory Visit

## 2019-03-15 DIAGNOSIS — R69 Illness, unspecified: Secondary | ICD-10-CM | POA: Diagnosis not present

## 2019-03-16 ENCOUNTER — Other Ambulatory Visit: Payer: Self-pay | Admitting: Family Medicine

## 2019-04-13 ENCOUNTER — Other Ambulatory Visit (HOSPITAL_COMMUNITY): Payer: Self-pay | Admitting: Family Medicine

## 2019-04-13 DIAGNOSIS — Z1231 Encounter for screening mammogram for malignant neoplasm of breast: Secondary | ICD-10-CM

## 2019-04-14 ENCOUNTER — Other Ambulatory Visit: Payer: Self-pay

## 2019-04-14 ENCOUNTER — Ambulatory Visit (HOSPITAL_COMMUNITY)
Admission: RE | Admit: 2019-04-14 | Discharge: 2019-04-14 | Disposition: A | Discharge status: Home / Self Care | Payer: Medicare HMO | Source: Ambulatory Visit | Attending: Family Medicine | Admitting: Family Medicine

## 2019-04-14 DIAGNOSIS — Z1231 Encounter for screening mammogram for malignant neoplasm of breast: Secondary | ICD-10-CM | POA: Diagnosis not present

## 2019-04-14 DIAGNOSIS — E7849 Other hyperlipidemia: Secondary | ICD-10-CM | POA: Diagnosis not present

## 2019-04-14 DIAGNOSIS — I10 Essential (primary) hypertension: Secondary | ICD-10-CM | POA: Diagnosis not present

## 2019-04-14 DIAGNOSIS — E119 Type 2 diabetes mellitus without complications: Secondary | ICD-10-CM | POA: Diagnosis not present

## 2019-04-15 LAB — HEPATIC FUNCTION PANEL
ALT: 17 IU/L (ref 0–32)
AST: 18 IU/L (ref 0–40)
Albumin: 4.7 g/dL (ref 3.8–4.8)
Alkaline Phosphatase: 62 IU/L (ref 39–117)
Bilirubin Total: 0.4 mg/dL (ref 0.0–1.2)
Bilirubin, Direct: 0.13 mg/dL (ref 0.00–0.40)
Total Protein: 7.1 g/dL (ref 6.0–8.5)

## 2019-04-15 LAB — BASIC METABOLIC PANEL
BUN/Creatinine Ratio: 11 — ABNORMAL LOW (ref 12–28)
BUN: 13 mg/dL (ref 8–27)
CO2: 33 mmol/L — ABNORMAL HIGH (ref 20–29)
Calcium: 9.9 mg/dL (ref 8.7–10.3)
Chloride: 101 mmol/L (ref 96–106)
Creatinine, Ser: 1.18 mg/dL — ABNORMAL HIGH (ref 0.57–1.00)
GFR calc Af Amer: 54 mL/min/{1.73_m2} — ABNORMAL LOW (ref >59)
GFR calc non Af Amer: 47 mL/min/{1.73_m2} — ABNORMAL LOW (ref >59)
Glucose: 178 mg/dL — ABNORMAL HIGH (ref 65–99)
Potassium: 3.9 mmol/L (ref 3.5–5.2)
Sodium: 142 mmol/L (ref 134–144)

## 2019-04-15 LAB — LIPID PANEL
Chol/HDL Ratio: 2.7 ratio (ref 0.0–4.4)
Cholesterol, Total: 166 mg/dL (ref 100–199)
HDL: 61 mg/dL (ref >39)
LDL Calculated: 89 mg/dL (ref 0–99)
Triglycerides: 81 mg/dL (ref 0–149)
VLDL Cholesterol Cal: 16 mg/dL (ref 5–40)

## 2019-04-15 LAB — HEMOGLOBIN A1C
Est. average glucose Bld gHb Est-mCnc: 169 mg/dL
Hgb A1c MFr Bld: 7.5 % — ABNORMAL HIGH (ref 4.8–5.6)

## 2019-04-18 ENCOUNTER — Other Ambulatory Visit: Payer: Self-pay | Admitting: *Deleted

## 2019-04-18 DIAGNOSIS — E119 Type 2 diabetes mellitus without complications: Secondary | ICD-10-CM

## 2019-04-28 ENCOUNTER — Telehealth: Payer: Self-pay | Admitting: Family Medicine

## 2019-04-28 NOTE — Telephone Encounter (Signed)
Pt concerned with left thigh burning when standing for a period of time, going on off & on for 6 months, worsening lately   NTBS?  Please advise & call pt

## 2019-04-28 NOTE — Telephone Encounter (Signed)
more than likely this is a superficial impingement of a nerve causing this issue.  Sometimes this will get better on its own sometimes it does not We can do a virtual visit anywhere in the next few weeks-unlikely to be a sign of any emergent issue

## 2019-04-29 NOTE — Telephone Encounter (Signed)
Patient notified and scheduled virtual visit with Dr Nicki Reaper to discus further

## 2019-05-05 DIAGNOSIS — L218 Other seborrheic dermatitis: Secondary | ICD-10-CM | POA: Diagnosis not present

## 2019-05-11 ENCOUNTER — Other Ambulatory Visit: Payer: Self-pay

## 2019-05-12 ENCOUNTER — Ambulatory Visit (INDEPENDENT_AMBULATORY_CARE_PROVIDER_SITE_OTHER): Payer: Medicare HMO | Admitting: Family Medicine

## 2019-05-12 DIAGNOSIS — M5432 Sciatica, left side: Secondary | ICD-10-CM

## 2019-05-12 NOTE — Progress Notes (Signed)
   Subjective:    Patient ID: Tracy Spencer, female    DOB: 1951/03/30, 68 y.o.   MRN: [id number]  HPI Pt states she is having burning in left leg. Pt states it is also aching. Pt states that her leg has been burning about one year but in the past 2 months the burning has became worse. Pt was washing dishes and her leg got to burning and aching so bad she had to hold left leg up and put all weight on right leg. Pt is wondering if her cholesterol med could be the cause of the burning.   Virtual Visit via Video Note  I connected with Tracy Spencer on 05/12/19 at  8:30 AM EDT by a video enabled telemedicine application and verified that I am speaking with the correct person using two identifiers.  Location: Patient: home Provider: office   I discussed the limitations of evaluation and management by telemedicine and the availability of in person appointments. The patient expressed understanding and agreed to proceed.  History of Present Illness:    Observations/Objective:   Assessment and Plan:   Follow Up Instructions:    I discussed the assessment and treatment plan with the patient. The patient was provided an opportunity to ask questions and all were answered. The patient agreed with the plan and demonstrated an understanding of the instructions.   The patient was advised to call back or seek an in-person evaluation if the symptoms worsen or if the condition fails to improve as anticipated.  I provided 15 minutes of non-face-to-face time during this encounter.   Vicente Males, LPN    Review of Systems  Constitutional: Negative for activity change and appetite change.  HENT: Negative for congestion and rhinorrhea.   Respiratory: Negative for cough and shortness of breath.   Cardiovascular: Negative for chest pain and leg swelling.  Gastrointestinal: Negative for abdominal pain, nausea and vomiting.  Skin: Negative for color change.  Neurological: Negative for  dizziness and weakness.  Psychiatric/Behavioral: Negative for agitation and confusion.  Burning down the left leg on the thigh region all the way down to the knee     Objective:   Physical Exam  Patient had virtual visit Appears to be in no distress Atraumatic Neuro able to relate and oriented No apparent resp distress Color normal       Assessment & Plan:  Burning in the leg and discomfort more than likely this is sciatica I will talk with her about the possibility of doing gabapentin also talked with her about the possibility of home exercises we will send her a pamphlet on this she is chosen to go with Tylenol and home exercises she will give [place] an update in 3 to 4 weeks if not doing any better we will need to do a in person visit  It should be noted that also told the patient I do not feel the burning in her leg is due to her statin

## 2019-05-14 ENCOUNTER — Other Ambulatory Visit: Payer: Self-pay | Admitting: Family Medicine

## 2019-05-31 DIAGNOSIS — E119 Type 2 diabetes mellitus without complications: Secondary | ICD-10-CM | POA: Diagnosis not present

## 2019-05-31 DIAGNOSIS — B351 Tinea unguium: Secondary | ICD-10-CM | POA: Diagnosis not present

## 2019-06-02 ENCOUNTER — Telehealth: Payer: Self-pay | Admitting: Family Medicine

## 2019-06-02 NOTE — Telephone Encounter (Signed)
Left message to return call 

## 2019-06-02 NOTE — Telephone Encounter (Signed)
Very unlikely to be her cholesterol medicine If ongoing troubles recommend reevaluation I feel it is safe for the patient to come for this type of evaluation if she would like to she can be scheduled for an appointment in the coming 10 days

## 2019-06-02 NOTE — Telephone Encounter (Signed)
Pt called back on 7/16 about her left leg / knee. She is calling back today to report since taking the tylenol her leg is not burning as much but it is still there. Also her muscle or joint at her knee is tightening up when she walks. Could it be the cholesterol medication?

## 2019-06-09 ENCOUNTER — Other Ambulatory Visit: Payer: Self-pay | Admitting: Family Medicine

## 2019-06-09 DIAGNOSIS — R69 Illness, unspecified: Secondary | ICD-10-CM | POA: Diagnosis not present

## 2019-06-09 NOTE — Telephone Encounter (Signed)
Patient advised per Dr Nicki Reaper: Very unlikely to be her cholesterol medicine If ongoing troubles recommend reevaluation Dr Nicki Reaper feels it is safe for the patient to come for this type of evaluation if she would like to she can be scheduled for an appointment in the coming 10 days Patient verbalized understanding and stated she was going to see how she does and call back to schedule visit if ongoing problems.

## 2019-06-21 ENCOUNTER — Other Ambulatory Visit: Payer: Self-pay

## 2019-06-21 ENCOUNTER — Ambulatory Visit: Payer: Medicare HMO | Admitting: Family Medicine

## 2019-06-21 ENCOUNTER — Encounter: Payer: Self-pay | Admitting: Family Medicine

## 2019-06-21 VITALS — BP 140/76 | Temp 96.3°F | Wt 193.4 lb

## 2019-06-21 DIAGNOSIS — M25562 Pain in left knee: Secondary | ICD-10-CM | POA: Diagnosis not present

## 2019-06-21 MED ORDER — GLIPIZIDE 5 MG PO TABS: ORAL_TABLET | ORAL | 5 refills | Status: DC

## 2019-06-21 NOTE — Progress Notes (Signed)
   Subjective:    Patient ID: Tracy Spencer, female    DOB: 12-18-1950, 68 y.o.   MRN: [id number]  Leg Pain  Incident onset: Since April. The pain is present in the left thigh and left knee. The quality of the pain is described as burning and aching (thigh area burning, aching when walks). Associated symptoms comments: Could not get out of bed due to pain. She has tried acetaminophen (muscle rub) for the symptoms. The treatment provided no relief.   Sciatica worse with stanging  Walk with limp Tylenol helps  Pain at night too Felt bad many days But occasionally ok     Review of Systems  Constitutional: Negative for activity change, fatigue and fever.  HENT: Negative for congestion and rhinorrhea.   Respiratory: Negative for cough, chest tightness and shortness of breath.   Cardiovascular: Negative for chest pain and leg swelling.  Gastrointestinal: Negative for abdominal pain and nausea.  Musculoskeletal: Positive for arthralgias and gait problem. Negative for back pain.  Skin: Negative for color change.  Neurological: Negative for dizziness and headaches.  Psychiatric/Behavioral: Negative for agitation and behavioral problems.       Objective:   Physical Exam Vitals signs reviewed.  Constitutional:      General: She is not in acute distress. HENT:     Head: Normocephalic and atraumatic.  Eyes:     General:        Right eye: No discharge.        Left eye: No discharge.  Neck:     Trachea: No tracheal deviation.  Cardiovascular:     Rate and Rhythm: Normal rate and regular rhythm.     Heart sounds: Normal heart sounds. No murmur.  Pulmonary:     Effort: Pulmonary effort is normal. No respiratory distress.     Breath sounds: Normal breath sounds.  Lymphadenopathy:     Cervical: No cervical adenopathy.  Skin:    General: Skin is warm and dry.  Neurological:     Mental Status: She is alert.     Coordination: Coordination normal.  Psychiatric:        Behavior:  Behavior normal.   Patient with subjective fullness behind the left knee it does feel enlarged could be a popliteal cyst in addition to this positive straight leg raise on the left   The pain is been going on for 6 weeks progressive she is now limping with it she describes pain behind the left knee but does also describe sciatica symptoms down the leg     Assessment & Plan:  Left knee pain along with some feelings of sciatica referral to orthopedist I believe the patient may wind up needing to have MRI of the lumbar spine possible ultrasound of the knee to look for popliteal cyst Tylenol as needed currently.  Hold off on any other medicines currently.

## 2019-06-23 ENCOUNTER — Other Ambulatory Visit: Payer: Self-pay | Admitting: Family Medicine

## 2019-06-23 ENCOUNTER — Telehealth: Payer: Self-pay | Admitting: Family Medicine

## 2019-06-23 DIAGNOSIS — M5432 Sciatica, left side: Secondary | ICD-10-CM

## 2019-06-23 DIAGNOSIS — M25562 Pain in left knee: Secondary | ICD-10-CM

## 2019-06-23 NOTE — Progress Notes (Signed)
[facility]-sciatica and knee pain

## 2019-06-23 NOTE — Progress Notes (Signed)
Referral has been placed. 

## 2019-06-23 NOTE — Telephone Encounter (Signed)
Patient needs referral for sciatica and knee pain [facility]

## 2019-06-23 NOTE — Telephone Encounter (Signed)
Referral has been placed to [facility]

## 2019-06-27 ENCOUNTER — Encounter: Payer: Self-pay | Admitting: Family Medicine

## 2019-06-27 ENCOUNTER — Telehealth: Payer: Self-pay | Admitting: Family Medicine

## 2019-06-27 MED ORDER — GABAPENTIN 100 MG PO CAPS: 100.0000 mg | ORAL_CAPSULE | Freq: Three times a day (TID) | ORAL | 0 refills | Status: DC

## 2019-06-27 NOTE — Telephone Encounter (Signed)
I saw this patient recently for knee pain and sciatica  She has some options Option #1 gabapentin-this is a nerve modulator that we recommend is a low-dose medication to try to help with this, it is not habit-forming, most people can tolerate up with some people have drowsiness with it if it causes significant side effects to stop  Option #2-we can prescribe some pain medication that can be used when the pain is severe but only when at home, it can cause drowsiness in some people, it is not meant for long-term use but intermittently use  Please talk with the patient see what she would like to do let me know which pharmacy we can send it in today

## 2019-06-27 NOTE — Telephone Encounter (Signed)
Gabapentin 100 mg, 1 taken 3 times daily, #90 with 2 refills  Please tell the patient to start off with I recommend 1 each evening for 5 days Then 1 twice daily for 5 days Then 1 taken 3 times daily  If not seeing significant improvement over the course of the next couple weeks let us know

## 2019-06-27 NOTE — Telephone Encounter (Signed)
Pt called requesting pain meds  States her leg has been hurting since she was seen  Please advise & call pt    [facility]

## 2019-06-27 NOTE — Telephone Encounter (Signed)
Patient would like to start by trying gabapentin first

## 2019-06-27 NOTE — Telephone Encounter (Signed)
Prescription sent electronically to pharmacy. Patient notified. 

## 2019-06-30 DIAGNOSIS — M545 Low back pain: Secondary | ICD-10-CM | POA: Diagnosis not present

## 2019-06-30 DIAGNOSIS — M25562 Pain in left knee: Secondary | ICD-10-CM | POA: Diagnosis not present

## 2019-07-07 DIAGNOSIS — M25562 Pain in left knee: Secondary | ICD-10-CM | POA: Diagnosis not present

## 2019-07-07 DIAGNOSIS — R262 Difficulty in walking, not elsewhere classified: Secondary | ICD-10-CM | POA: Diagnosis not present

## 2019-07-07 DIAGNOSIS — M799 Soft tissue disorder, unspecified: Secondary | ICD-10-CM | POA: Diagnosis not present

## 2019-07-07 DIAGNOSIS — M6281 Muscle weakness (generalized): Secondary | ICD-10-CM | POA: Diagnosis not present

## 2019-07-07 DIAGNOSIS — M25662 Stiffness of left knee, not elsewhere classified: Secondary | ICD-10-CM | POA: Diagnosis not present

## 2019-07-08 ENCOUNTER — Telehealth: Payer: Self-pay | Admitting: Family Medicine

## 2019-07-08 NOTE — Telephone Encounter (Signed)
Please advise. Thank you

## 2019-07-08 NOTE — Telephone Encounter (Signed)
Patient mailed in reading for you to review in your box.

## 2019-07-11 ENCOUNTER — Other Ambulatory Visit: Payer: Self-pay | Admitting: Family Medicine

## 2019-07-12 DIAGNOSIS — M799 Soft tissue disorder, unspecified: Secondary | ICD-10-CM | POA: Diagnosis not present

## 2019-07-12 DIAGNOSIS — M25662 Stiffness of left knee, not elsewhere classified: Secondary | ICD-10-CM | POA: Diagnosis not present

## 2019-07-12 DIAGNOSIS — M25562 Pain in left knee: Secondary | ICD-10-CM | POA: Diagnosis not present

## 2019-07-12 DIAGNOSIS — M6281 Muscle weakness (generalized): Secondary | ICD-10-CM | POA: Diagnosis not present

## 2019-07-12 DIAGNOSIS — R262 Difficulty in walking, not elsewhere classified: Secondary | ICD-10-CM | POA: Diagnosis not present

## 2019-07-14 DIAGNOSIS — R262 Difficulty in walking, not elsewhere classified: Secondary | ICD-10-CM | POA: Diagnosis not present

## 2019-07-14 DIAGNOSIS — M25562 Pain in left knee: Secondary | ICD-10-CM | POA: Diagnosis not present

## 2019-07-14 DIAGNOSIS — M799 Soft tissue disorder, unspecified: Secondary | ICD-10-CM | POA: Diagnosis not present

## 2019-07-14 DIAGNOSIS — M6281 Muscle weakness (generalized): Secondary | ICD-10-CM | POA: Diagnosis not present

## 2019-07-14 DIAGNOSIS — M25662 Stiffness of left knee, not elsewhere classified: Secondary | ICD-10-CM | POA: Diagnosis not present

## 2019-07-19 ENCOUNTER — Telehealth: Payer: Self-pay | Admitting: Family Medicine

## 2019-07-19 DIAGNOSIS — R262 Difficulty in walking, not elsewhere classified: Secondary | ICD-10-CM | POA: Diagnosis not present

## 2019-07-19 DIAGNOSIS — M25662 Stiffness of left knee, not elsewhere classified: Secondary | ICD-10-CM | POA: Diagnosis not present

## 2019-07-19 DIAGNOSIS — M6281 Muscle weakness (generalized): Secondary | ICD-10-CM | POA: Diagnosis not present

## 2019-07-19 DIAGNOSIS — M25562 Pain in left knee: Secondary | ICD-10-CM | POA: Diagnosis not present

## 2019-07-19 DIAGNOSIS — M799 Soft tissue disorder, unspecified: Secondary | ICD-10-CM | POA: Diagnosis not present

## 2019-07-19 NOTE — Telephone Encounter (Signed)
Form was reviewed see other telephone message

## 2019-07-19 NOTE — Telephone Encounter (Signed)
Patient sent as some glucose readings Overall these readings do show improvement for the most part  I would recommend the patient does her metabolic 7 and V8P before her visit in a couple weeks that she already has scheduled  Continue the glipizide as currently taking

## 2019-07-19 NOTE — Telephone Encounter (Signed)
Patient advised per Dr Nicki Reaper:  Overall glucose readings do show improvement for the most part  Dr Nicki Reaper would recommend the patient does her metabolic 7 and Q6S before her visit in a couple weeks that she already has scheduled  Continue the glipizide as currently taking.  Patient verbalized understanding.

## 2019-07-21 ENCOUNTER — Telehealth: Payer: Self-pay | Admitting: Family Medicine

## 2019-07-21 DIAGNOSIS — M25662 Stiffness of left knee, not elsewhere classified: Secondary | ICD-10-CM | POA: Diagnosis not present

## 2019-07-21 DIAGNOSIS — M799 Soft tissue disorder, unspecified: Secondary | ICD-10-CM | POA: Diagnosis not present

## 2019-07-21 DIAGNOSIS — M25562 Pain in left knee: Secondary | ICD-10-CM | POA: Diagnosis not present

## 2019-07-21 DIAGNOSIS — M6281 Muscle weakness (generalized): Secondary | ICD-10-CM | POA: Diagnosis not present

## 2019-07-21 DIAGNOSIS — R262 Difficulty in walking, not elsewhere classified: Secondary | ICD-10-CM | POA: Diagnosis not present

## 2019-07-21 MED ORDER — ONETOUCH ULTRA VI STRP: ORAL_STRIP | 1 refills | Status: DC

## 2019-07-21 MED ORDER — ONETOUCH DELICA LANCETS 33G MISC: 1 refills | Status: DC

## 2019-07-21 NOTE — Telephone Encounter (Signed)
Pt needs Rx for new glucometer & test strips & lancet device  [facility]  Please call pt when done

## 2019-07-21 NOTE — Telephone Encounter (Addendum)
Prescription sent electronically to pharmacy. Patient notified. 

## 2019-07-21 NOTE — Addendum Note (Signed)
Addended by: Dairl Ponder on: 07/21/2019 02:53 PM   Modules accepted: Orders

## 2019-07-25 ENCOUNTER — Telehealth: Payer: Self-pay | Admitting: Family Medicine

## 2019-07-25 DIAGNOSIS — R262 Difficulty in walking, not elsewhere classified: Secondary | ICD-10-CM | POA: Diagnosis not present

## 2019-07-25 DIAGNOSIS — M25562 Pain in left knee: Secondary | ICD-10-CM | POA: Diagnosis not present

## 2019-07-25 DIAGNOSIS — M25662 Stiffness of left knee, not elsewhere classified: Secondary | ICD-10-CM | POA: Diagnosis not present

## 2019-07-25 DIAGNOSIS — M799 Soft tissue disorder, unspecified: Secondary | ICD-10-CM | POA: Diagnosis not present

## 2019-07-25 DIAGNOSIS — M6281 Muscle weakness (generalized): Secondary | ICD-10-CM | POA: Diagnosis not present

## 2019-07-25 MED ORDER — BLOOD GLUCOSE METER KIT: PACK | 0 refills | Status: DC

## 2019-07-25 NOTE — Telephone Encounter (Signed)
Script printed out and pt is aware. Once signed by provider, we will fax it over to [facility]. Pt verbalized understanding.

## 2019-07-25 NOTE — Telephone Encounter (Signed)
We sent order for new glucose meter, test strips, & lancet device to [facility]  The pharmacy told pt we did not send an order for a new monitor - please see previous phone messag & please resend order for a new monitor to [facility]  Please advise & call pt

## 2019-07-26 DIAGNOSIS — R69 Illness, unspecified: Secondary | ICD-10-CM | POA: Diagnosis not present

## 2019-07-27 DIAGNOSIS — M799 Soft tissue disorder, unspecified: Secondary | ICD-10-CM | POA: Diagnosis not present

## 2019-07-27 DIAGNOSIS — M25662 Stiffness of left knee, not elsewhere classified: Secondary | ICD-10-CM | POA: Diagnosis not present

## 2019-07-27 DIAGNOSIS — M6281 Muscle weakness (generalized): Secondary | ICD-10-CM | POA: Diagnosis not present

## 2019-07-27 DIAGNOSIS — R69 Illness, unspecified: Secondary | ICD-10-CM | POA: Diagnosis not present

## 2019-07-27 DIAGNOSIS — M25562 Pain in left knee: Secondary | ICD-10-CM | POA: Diagnosis not present

## 2019-07-27 DIAGNOSIS — R262 Difficulty in walking, not elsewhere classified: Secondary | ICD-10-CM | POA: Diagnosis not present

## 2019-07-27 DIAGNOSIS — E119 Type 2 diabetes mellitus without complications: Secondary | ICD-10-CM | POA: Diagnosis not present

## 2019-07-28 LAB — BASIC METABOLIC PANEL
BUN/Creatinine Ratio: 10 — ABNORMAL LOW (ref 12–28)
BUN: 13 mg/dL (ref 8–27)
CO2: 27 mmol/L (ref 20–29)
Calcium: 9.9 mg/dL (ref 8.7–10.3)
Chloride: 99 mmol/L (ref 96–106)
Creatinine, Ser: 1.26 mg/dL — ABNORMAL HIGH (ref 0.57–1.00)
GFR calc Af Amer: 50 mL/min/{1.73_m2} — ABNORMAL LOW (ref >59)
GFR calc non Af Amer: 43 mL/min/{1.73_m2} — ABNORMAL LOW (ref >59)
Glucose: 125 mg/dL — ABNORMAL HIGH (ref 65–99)
Potassium: 3.4 mmol/L — ABNORMAL LOW (ref 3.5–5.2)
Sodium: 138 mmol/L (ref 134–144)

## 2019-07-28 LAB — HEMOGLOBIN A1C
Est. average glucose Bld gHb Est-mCnc: 151 mg/dL
Hgb A1c MFr Bld: 6.9 % — ABNORMAL HIGH (ref 4.8–5.6)

## 2019-08-02 DIAGNOSIS — M25562 Pain in left knee: Secondary | ICD-10-CM | POA: Diagnosis not present

## 2019-08-02 DIAGNOSIS — M25662 Stiffness of left knee, not elsewhere classified: Secondary | ICD-10-CM | POA: Diagnosis not present

## 2019-08-02 DIAGNOSIS — R262 Difficulty in walking, not elsewhere classified: Secondary | ICD-10-CM | POA: Diagnosis not present

## 2019-08-02 DIAGNOSIS — M799 Soft tissue disorder, unspecified: Secondary | ICD-10-CM | POA: Diagnosis not present

## 2019-08-02 DIAGNOSIS — M6281 Muscle weakness (generalized): Secondary | ICD-10-CM | POA: Diagnosis not present

## 2019-08-03 ENCOUNTER — Other Ambulatory Visit: Payer: Self-pay

## 2019-08-03 ENCOUNTER — Ambulatory Visit (INDEPENDENT_AMBULATORY_CARE_PROVIDER_SITE_OTHER): Payer: Medicare HMO | Admitting: Family Medicine

## 2019-08-03 ENCOUNTER — Encounter: Payer: Self-pay | Admitting: Family Medicine

## 2019-08-03 VITALS — BP 124/78 | Temp 96.8°F | Wt 191.8 lb

## 2019-08-03 DIAGNOSIS — E119 Type 2 diabetes mellitus without complications: Secondary | ICD-10-CM | POA: Diagnosis not present

## 2019-08-03 DIAGNOSIS — I10 Essential (primary) hypertension: Secondary | ICD-10-CM | POA: Diagnosis not present

## 2019-08-03 DIAGNOSIS — N289 Disorder of kidney and ureter, unspecified: Secondary | ICD-10-CM

## 2019-08-03 DIAGNOSIS — Z23 Encounter for immunization: Secondary | ICD-10-CM

## 2019-08-03 DIAGNOSIS — M25562 Pain in left knee: Secondary | ICD-10-CM

## 2019-08-03 DIAGNOSIS — E7849 Other hyperlipidemia: Secondary | ICD-10-CM | POA: Diagnosis not present

## 2019-08-03 MED ORDER — AMLODIPINE BESYLATE 5 MG PO TABS: ORAL_TABLET | ORAL | 1 refills | Status: DC

## 2019-08-03 MED ORDER — POTASSIUM CHLORIDE CRYS ER 20 MEQ PO TBCR: EXTENDED_RELEASE_TABLET | ORAL | 1 refills | Status: DC

## 2019-08-03 MED ORDER — ROSUVASTATIN CALCIUM 40 MG PO TABS: ORAL_TABLET | ORAL | 1 refills | Status: DC

## 2019-08-03 MED ORDER — LOSARTAN POTASSIUM 100 MG PO TABS: ORAL_TABLET | ORAL | 5 refills | Status: DC

## 2019-08-03 NOTE — Progress Notes (Signed)
Subjective:    Patient ID: Tracy Spencer, female    DOB: 1950/10/15, 68 y.o.   MRN: [id number]  HPI Pt here today for follow up on knee pain. Pt states her knees is still hurting but not as bad. Pt states her left knee is now swollen. She is now able to put pressure on leg now.  Patient relates pain behind the knee hurts with certain movements.  Does not radiate down the leg.  Does not feel gabapentin is helping She saw the orthopedist.  The did not examination center for therapy she states therapy has not really helped much  The patient was seen today as part of a comprehensive diabetic check up.the patient does have diabetes.  The patient follows here on a regular basis.  The patient relates medication compliance. No significant side effects to the medications. Denies any low glucose spells. Relates compliance with diet to a reasonable level. Patient does do labwork intermittently and understands the dangers of diabetes.  She states her sugars overall been doing well lately taking her medications  Patient for blood pressure check up.  The patient does have hypertension.  The patient is on medication.  Patient relates compliance with meds. Todays BP reviewed with the patient. Patient denies issues with medication. Patient relates reasonable diet. Patient tries to minimize salt. Patient aware of BP goals. She states she is taken her medicines but she notes her blood pressure is doing okay Patient did have recent lab work Results for orders placed or performed in visit on 04/18/19  Hemoglobin A1c  Result Value Ref Range   Hgb A1c MFr Bld 6.9 (H) 4.8 - 5.6 %   Est. average glucose Bld gHb Est-mCnc 151 mg/dL  Basic metabolic panel  Result Value Ref Range   Glucose 125 (H) 65 - 99 mg/dL   BUN 13 8 - 27 mg/dL   Creatinine, Ser 1.26 (H) 0.57 - 1.00 mg/dL   GFR calc non Af Amer 43 (L) >59 mL/min/1.73   GFR calc Af Amer 50 (L) >59 mL/min/1.73   BUN/Creatinine Ratio 10 (L) 12 - 28   Sodium  138 134 - 144 mmol/L   Potassium 3.4 (L) 3.5 - 5.2 mmol/L   Chloride 99 96 - 106 mmol/L   CO2 27 20 - 29 mmol/L   Calcium 9.9 8.7 - 10.3 mg/dL   Labs were reviewed in detail Review of Systems  Constitutional: Negative for activity change, appetite change and fatigue.  HENT: Negative for congestion and rhinorrhea.   Respiratory: Negative for cough and shortness of breath.   Cardiovascular: Negative for chest pain and leg swelling.  Gastrointestinal: Negative for abdominal pain and diarrhea.  Endocrine: Negative for polydipsia and polyphagia.  Musculoskeletal: Positive for arthralgias and joint swelling. Negative for back pain and gait problem.  Skin: Negative for color change.  Neurological: Negative for dizziness and weakness.  Psychiatric/Behavioral: Negative for behavioral problems and confusion.       Objective:   Physical Exam Vitals signs reviewed.  Constitutional:      General: She is not in acute distress. HENT:     Head: Normocephalic and atraumatic.  Eyes:     General:        Right eye: No discharge.        Left eye: No discharge.  Neck:     Trachea: No tracheal deviation.  Cardiovascular:     Rate and Rhythm: Normal rate and regular rhythm.     Heart sounds: Normal heart  sounds. No murmur.  Pulmonary:     Effort: Pulmonary effort is normal. No respiratory distress.     Breath sounds: Normal breath sounds.  Lymphadenopathy:     Cervical: No cervical adenopathy.  Skin:    General: Skin is warm and dry.  Neurological:     Mental Status: She is alert.     Coordination: Coordination normal.  Psychiatric:        Behavior: Behavior normal.   Ligament stable in the left knee has some swollen area behind the knee which could be a Baker's cyst   Blood pressure rechecked with large cuff very good reading No further physical therapy necessary    Assessment & Plan:  1. Renal insufficiency I am concerned about the renal insufficiency we will stop the HCTZ portion  of her medications.  We will have her repeat her lab work again in approximately [redacted] weeks along with a urine micro protein there is a possibility we may be tapering off of Metformin coming up depending on how this progresses - Urine Microalbumin w/creat. ratio - Basic Metabolic Panel (BMET)  2. Acute pain of left knee I do recommend ultrasound to look at left knee to rule out the possibility of a Baker's cyst - [place] LT LOWER EXTREM LTD SOFT TISSUE NON VASCULAR  3. Type 2 diabetes mellitus without complication, without long-term current use of insulin (HCC) A1c looks good continue current measures but if GFR goes down we will be stopping Metformin - Urine Microalbumin w/creat. ratio - Basic Metabolic Panel (BMET)  4. Essential hypertension, benign Blood pressure good control currently watch diet minimize salt stay active take medicines - Urine Microalbumin w/creat. ratio - Basic Metabolic Panel (BMET)  5. Other hyperlipidemia Lab work looks good for cholesterol continue current measures. - Urine Microalbumin w/creat. ratio - Basic Metabolic Panel (BMET)  6. Need for vaccination Today - Flu Vaccine QUAD 6+ mos PF IM (Fluarix Quad PF)

## 2019-08-08 ENCOUNTER — Other Ambulatory Visit: Payer: Self-pay

## 2019-08-08 ENCOUNTER — Ambulatory Visit (HOSPITAL_COMMUNITY)
Admission: RE | Admit: 2019-08-08 | Discharge: 2019-08-08 | Disposition: A | Discharge status: Home / Self Care | Payer: Medicare HMO | Source: Ambulatory Visit | Attending: Family Medicine | Admitting: Family Medicine

## 2019-08-08 DIAGNOSIS — M25562 Pain in left knee: Secondary | ICD-10-CM | POA: Insufficient documentation

## 2019-08-08 DIAGNOSIS — M7122 Synovial cyst of popliteal space [Baker], left knee: Secondary | ICD-10-CM | POA: Diagnosis not present

## 2019-08-17 DIAGNOSIS — M25562 Pain in left knee: Secondary | ICD-10-CM | POA: Diagnosis not present

## 2019-08-29 DIAGNOSIS — M25562 Pain in left knee: Secondary | ICD-10-CM | POA: Diagnosis not present

## 2019-08-30 DIAGNOSIS — I10 Essential (primary) hypertension: Secondary | ICD-10-CM | POA: Diagnosis not present

## 2019-08-30 DIAGNOSIS — N289 Disorder of kidney and ureter, unspecified: Secondary | ICD-10-CM | POA: Diagnosis not present

## 2019-08-30 DIAGNOSIS — E7849 Other hyperlipidemia: Secondary | ICD-10-CM | POA: Diagnosis not present

## 2019-08-30 DIAGNOSIS — E119 Type 2 diabetes mellitus without complications: Secondary | ICD-10-CM | POA: Diagnosis not present

## 2019-08-31 LAB — MICROALBUMIN / CREATININE URINE RATIO
Creatinine, Urine: 66.2 mg/dL
Microalb/Creat Ratio: 86 mg/g creat — ABNORMAL HIGH (ref 0–29)
Microalbumin, Urine: 56.8 ug/mL

## 2019-08-31 LAB — BASIC METABOLIC PANEL
BUN/Creatinine Ratio: 10 — ABNORMAL LOW (ref 12–28)
BUN: 12 mg/dL (ref 8–27)
CO2: 22 mmol/L (ref 20–29)
Calcium: 9.9 mg/dL (ref 8.7–10.3)
Chloride: 102 mmol/L (ref 96–106)
Creatinine, Ser: 1.19 mg/dL — ABNORMAL HIGH (ref 0.57–1.00)
GFR calc Af Amer: 53 mL/min/{1.73_m2} — ABNORMAL LOW (ref >59)
GFR calc non Af Amer: 46 mL/min/{1.73_m2} — ABNORMAL LOW (ref >59)
Glucose: 112 mg/dL — ABNORMAL HIGH (ref 65–99)
Potassium: 4 mmol/L (ref 3.5–5.2)
Sodium: 140 mmol/L (ref 134–144)

## 2019-09-01 ENCOUNTER — Encounter: Payer: Self-pay | Admitting: Family Medicine

## 2019-09-02 DIAGNOSIS — S83242A Other tear of medial meniscus, current injury, left knee, initial encounter: Secondary | ICD-10-CM | POA: Diagnosis not present

## 2019-09-02 DIAGNOSIS — M25562 Pain in left knee: Secondary | ICD-10-CM | POA: Diagnosis not present

## 2019-09-26 DIAGNOSIS — S83282A Other tear of lateral meniscus, current injury, left knee, initial encounter: Secondary | ICD-10-CM | POA: Diagnosis not present

## 2019-09-26 DIAGNOSIS — M1712 Unilateral primary osteoarthritis, left knee: Secondary | ICD-10-CM | POA: Diagnosis not present

## 2019-09-26 DIAGNOSIS — S83242D Other tear of medial meniscus, current injury, left knee, subsequent encounter: Secondary | ICD-10-CM | POA: Diagnosis not present

## 2019-10-11 DIAGNOSIS — S83232A Complex tear of medial meniscus, current injury, left knee, initial encounter: Secondary | ICD-10-CM | POA: Diagnosis not present

## 2019-10-11 DIAGNOSIS — M94262 Chondromalacia, left knee: Secondary | ICD-10-CM | POA: Diagnosis not present

## 2019-10-11 DIAGNOSIS — S83272A Complex tear of lateral meniscus, current injury, left knee, initial encounter: Secondary | ICD-10-CM | POA: Diagnosis not present

## 2019-10-11 DIAGNOSIS — Y999 Unspecified external cause status: Secondary | ICD-10-CM | POA: Diagnosis not present

## 2019-10-23 ENCOUNTER — Other Ambulatory Visit: Payer: Self-pay | Admitting: Family Medicine

## 2019-10-24 NOTE — Telephone Encounter (Signed)
90-day refill needs follow-up visit later by March

## 2019-12-08 ENCOUNTER — Telehealth: Payer: Self-pay | Admitting: Family Medicine

## 2019-12-08 DIAGNOSIS — E785 Hyperlipidemia, unspecified: Secondary | ICD-10-CM

## 2019-12-08 DIAGNOSIS — E119 Type 2 diabetes mellitus without complications: Secondary | ICD-10-CM

## 2019-12-08 DIAGNOSIS — I10 Essential (primary) hypertension: Secondary | ICD-10-CM

## 2019-12-08 DIAGNOSIS — Z79899 Other long term (current) drug therapy: Secondary | ICD-10-CM

## 2019-12-08 NOTE — Telephone Encounter (Signed)
Lipid, liver, metabolic 7, A1c, urine micro protein Diagnosis hyperlipidemia hypertension diabetes Please have the patient do these labs before doing a follow-up visit later in the spring Patient may have 90 days on her medicine either 90 days at 1 time or 30 with refills what ever she prefers thank you

## 2019-12-08 NOTE — Telephone Encounter (Signed)
[facility] requesting refill on Rosuvastatin 40 mg. Take one tablet po daily. Pt last seen 08/03/2019 for renal insufficiency. Please advise. Thank you

## 2019-12-09 NOTE — Telephone Encounter (Signed)
Called pt and she states she does not want to take the cholesterol med until she talks with dr Lorin Picket. Bw orders put in and pt advised to do bw before appt and pt transferred to the front to schedule visit with dr scott to discuss cholesterol med.

## 2019-12-09 NOTE — Addendum Note (Signed)
Addended by: Metro Kung on: 12/09/2019 09:28 AM   Modules accepted: Orders

## 2019-12-12 ENCOUNTER — Other Ambulatory Visit: Payer: Self-pay

## 2019-12-12 ENCOUNTER — Ambulatory Visit (HOSPITAL_COMMUNITY)
Admission: RE | Admit: 2019-12-12 | Discharge: 2019-12-12 | Disposition: A | Discharge status: Home / Self Care | Payer: Medicare HMO | Source: Ambulatory Visit | Attending: Family Medicine | Admitting: Family Medicine

## 2019-12-12 DIAGNOSIS — Z780 Asymptomatic menopausal state: Secondary | ICD-10-CM | POA: Insufficient documentation

## 2019-12-12 DIAGNOSIS — M810 Age-related osteoporosis without current pathological fracture: Secondary | ICD-10-CM | POA: Insufficient documentation

## 2019-12-13 LAB — HEPATIC FUNCTION PANEL
ALT: 15 IU/L (ref 0–32)
AST: 17 IU/L (ref 0–40)
Albumin: 4.7 g/dL (ref 3.8–4.8)
Alkaline Phosphatase: 82 IU/L (ref 39–117)
Bilirubin Total: 0.4 mg/dL (ref 0.0–1.2)
Bilirubin, Direct: 0.12 mg/dL (ref 0.00–0.40)
Total Protein: 7.7 g/dL (ref 6.0–8.5)

## 2019-12-13 LAB — BASIC METABOLIC PANEL
BUN/Creatinine Ratio: 9 — ABNORMAL LOW (ref 12–28)
BUN: 10 mg/dL (ref 8–27)
CO2: 23 mmol/L (ref 20–29)
Calcium: 10.3 mg/dL (ref 8.7–10.3)
Chloride: 102 mmol/L (ref 96–106)
Creatinine, Ser: 1.13 mg/dL — ABNORMAL HIGH (ref 0.57–1.00)
GFR calc Af Amer: 57 mL/min/{1.73_m2} — ABNORMAL LOW (ref >59)
GFR calc non Af Amer: 49 mL/min/{1.73_m2} — ABNORMAL LOW (ref >59)
Glucose: 125 mg/dL — ABNORMAL HIGH (ref 65–99)
Potassium: 3.9 mmol/L (ref 3.5–5.2)
Sodium: 140 mmol/L (ref 134–144)

## 2019-12-13 LAB — MICROALBUMIN / CREATININE URINE RATIO
Creatinine, Urine: 130.1 mg/dL
Microalb/Creat Ratio: 24 mg/g creat (ref 0–29)
Microalbumin, Urine: 30.7 ug/mL

## 2019-12-13 LAB — LIPID PANEL
Chol/HDL Ratio: 2.6 ratio (ref 0.0–4.4)
Cholesterol, Total: 171 mg/dL (ref 100–199)
HDL: 66 mg/dL (ref >39)
LDL Chol Calc (NIH): 89 mg/dL (ref 0–99)
Triglycerides: 90 mg/dL (ref 0–149)
VLDL Cholesterol Cal: 16 mg/dL (ref 5–40)

## 2019-12-13 LAB — HEMOGLOBIN A1C
Est. average glucose Bld gHb Est-mCnc: 134 mg/dL
Hgb A1c MFr Bld: 6.3 % — ABNORMAL HIGH (ref 4.8–5.6)

## 2019-12-16 ENCOUNTER — Telehealth: Payer: Self-pay | Admitting: Family Medicine

## 2019-12-16 NOTE — Telephone Encounter (Signed)
So obviously this is a message where a nurse needs to connect with the pt and se if she is having symtoms, if not, may schedule

## 2019-12-16 NOTE — Telephone Encounter (Signed)
Patient stated she does not have the virus-she was tested due to exposure and was NEGATIVE and doesn't have the virus

## 2019-12-16 NOTE — Telephone Encounter (Signed)
Called pt to do prescreening  Pt states she was tested for Covid on 12/09/2019 & results were negative  She got tested because she was around someone that was possibly exposed by someone else  Didn't get to any symptom questions (pt hung up as I was asking a nurse if we should reschedule)  Please advise - appointment is Monday 12/19/2019 @ 1:10 with Dr. Lorin Picket for knee issue

## 2019-12-16 NOTE — Telephone Encounter (Signed)
Left message to return call 

## 2019-12-16 NOTE — Telephone Encounter (Signed)
I already test was negative. My interpretation of message is patient has not yet responded regarding any symptoms from a regular screening standpoint. So either nurses or front still need to speak to the patient regarding any symptoms.

## 2019-12-19 ENCOUNTER — Ambulatory Visit: Payer: Medicare HMO | Admitting: Family Medicine

## 2019-12-19 ENCOUNTER — Other Ambulatory Visit: Payer: Self-pay

## 2019-12-19 VITALS — BP 122/82 | Temp 97.6°F | Wt 194.6 lb

## 2019-12-19 DIAGNOSIS — I10 Essential (primary) hypertension: Secondary | ICD-10-CM | POA: Diagnosis not present

## 2019-12-19 DIAGNOSIS — E7849 Other hyperlipidemia: Secondary | ICD-10-CM

## 2019-12-19 DIAGNOSIS — E119 Type 2 diabetes mellitus without complications: Secondary | ICD-10-CM

## 2019-12-19 DIAGNOSIS — M816 Localized osteoporosis [Lequesne]: Secondary | ICD-10-CM

## 2019-12-19 DIAGNOSIS — M6740 Ganglion, unspecified site: Secondary | ICD-10-CM | POA: Diagnosis not present

## 2019-12-19 MED ORDER — METFORMIN HCL 500 MG PO TABS: ORAL_TABLET | ORAL | 1 refills | Status: DC

## 2019-12-19 MED ORDER — GLIPIZIDE 5 MG PO TABS: ORAL_TABLET | ORAL | 1 refills | Status: DC

## 2019-12-19 MED ORDER — LOSARTAN POTASSIUM 100 MG PO TABS: ORAL_TABLET | ORAL | 1 refills | Status: DC

## 2019-12-19 MED ORDER — AMLODIPINE BESYLATE 5 MG PO TABS: ORAL_TABLET | ORAL | 1 refills | Status: DC

## 2019-12-19 MED ORDER — POTASSIUM CHLORIDE CRYS ER 20 MEQ PO TBCR: EXTENDED_RELEASE_TABLET | ORAL | 1 refills | Status: DC

## 2019-12-19 MED ORDER — ROSUVASTATIN CALCIUM 40 MG PO TABS: ORAL_TABLET | ORAL | 1 refills | Status: DC

## 2019-12-19 NOTE — Telephone Encounter (Signed)
Pt had appt today.

## 2019-12-19 NOTE — Patient Instructions (Signed)
Results for orders placed or performed in visit on 12/08/19  Lipid panel  Result Value Ref Range   Cholesterol, Total 171 100 - 199 mg/dL   Triglycerides 90 0 - 149 mg/dL   HDL 66 >96 mg/dL   VLDL Cholesterol Cal 16 5 - 40 mg/dL   LDL Chol Calc (NIH) 89 0 - 99 mg/dL   Chol/HDL Ratio 2.6 0.0 - 4.4 ratio  Hepatic function panel  Result Value Ref Range   Total Protein 7.7 6.0 - 8.5 g/dL   Albumin 4.7 3.8 - 4.8 g/dL   Bilirubin Total 0.4 0.0 - 1.2 mg/dL   Bilirubin, Direct 2.83 0.00 - 0.40 mg/dL   Alkaline Phosphatase 82 39 - 117 IU/L   AST 17 0 - 40 IU/L   ALT 15 0 - 32 IU/L  Basic metabolic panel  Result Value Ref Range   Glucose 125 (H) 65 - 99 mg/dL   BUN 10 8 - 27 mg/dL   Creatinine, Ser 6.62 (H) 0.57 - 1.00 mg/dL   GFR calc non Af Amer 49 (L) >59 mL/min/1.73   GFR calc Af Amer 57 (L) >59 mL/min/1.73   BUN/Creatinine Ratio 9 (L) 12 - 28   Sodium 140 134 - 144 mmol/L   Potassium 3.9 3.5 - 5.2 mmol/L   Chloride 102 96 - 106 mmol/L   CO2 23 20 - 29 mmol/L   Calcium 10.3 8.7 - 10.3 mg/dL  Hemoglobin H4T  Result Value Ref Range   Hgb A1c MFr Bld 6.3 (H) 4.8 - 5.6 %   Est. average glucose Bld gHb Est-mCnc 134 mg/dL  Microalbumin / creatinine urine ratio  Result Value Ref Range   Creatinine, Urine 130.1 Not Estab. mg/dL   Microalbumin, Urine 65.4 Not Estab. ug/mL   Microalb/Creat Ratio 24 0 - 29 mg/g creat

## 2019-12-19 NOTE — Progress Notes (Signed)
Subjective:    Patient ID: Tracy Spencer, female    DOB: 1951/10/07, 69 y.o.   MRN: [id number]  Diabetes She presents for her follow-up diabetic visit. She has type 2 diabetes mellitus. Pertinent negatives for hypoglycemia include no confusion or dizziness. Pertinent negatives for diabetes include no chest pain, no fatigue, no polydipsia, no polyphagia and no weakness. Risk factors for coronary artery disease include diabetes mellitus, dyslipidemia, hypertension and post-menopausal. Current diabetic treatment includes oral agent (dual therapy). She is compliant with treatment all of the time. Her weight is stable. She is following a diabetic diet.   Results for orders placed or performed in visit on 12/08/19  Lipid panel  Result Value Ref Range   Cholesterol, Total 171 100 - 199 mg/dL   Triglycerides 90 0 - 149 mg/dL   HDL 66 >37 mg/dL   VLDL Cholesterol Cal 16 5 - 40 mg/dL   LDL Chol Calc (NIH) 89 0 - 99 mg/dL   Chol/HDL Ratio 2.6 0.0 - 4.4 ratio  Hepatic function panel  Result Value Ref Range   Total Protein 7.7 6.0 - 8.5 g/dL   Albumin 4.7 3.8 - 4.8 g/dL   Bilirubin Total 0.4 0.0 - 1.2 mg/dL   Bilirubin, Direct 6.28 0.00 - 0.40 mg/dL   Alkaline Phosphatase 82 39 - 117 IU/L   AST 17 0 - 40 IU/L   ALT 15 0 - 32 IU/L  Basic metabolic panel  Result Value Ref Range   Glucose 125 (H) 65 - 99 mg/dL   BUN 10 8 - 27 mg/dL   Creatinine, Ser 3.15 (H) 0.57 - 1.00 mg/dL   GFR calc non Af Amer 49 (L) >59 mL/min/1.73   GFR calc Af Amer 57 (L) >59 mL/min/1.73   BUN/Creatinine Ratio 9 (L) 12 - 28   Sodium 140 134 - 144 mmol/L   Potassium 3.9 3.5 - 5.2 mmol/L   Chloride 102 96 - 106 mmol/L   CO2 23 20 - 29 mmol/L   Calcium 10.3 8.7 - 10.3 mg/dL  Hemoglobin V7O  Result Value Ref Range   Hgb A1c MFr Bld 6.3 (H) 4.8 - 5.6 %   Est. average glucose Bld gHb Est-mCnc 134 mg/dL  Microalbumin / creatinine urine ratio  Result Value Ref Range   Creatinine, Urine 130.1 Not Estab. mg/dL   Microalbumin, Urine 16.0 Not Estab. ug/mL   Microalb/Creat Ratio 24 0 - 29 mg/g creat   Hello  Visit vaccine good good yes good good good glad to hear Ganglion cyst  Essential hypertension, benign  Type 2 diabetes mellitus without complication, without long-term current use of insulin (HCC)  Other hyperlipidemia  Localized osteoporosis, unspecified pathological fracture presence  30 minutes spent between chart review reviewing results with the patient discussing her current issues and documentation Patient has knot on top of left hand- also has been out of crestor for 2 weeks and needs refill Fall Risk  12/19/2019 08/03/2019 08/24/2018 08/24/2018 02/25/2017  Falls in the past year? 0 0 0 0 No  Number falls in past yr: - - - 0 -  Injury with Fall? - - - 0 -  Follow up Falls evaluation completed Falls evaluation completed - Education provided -    Review of Systems  Constitutional: Negative for activity change, appetite change and fatigue.  HENT: Negative for congestion and rhinorrhea.   Respiratory: Negative for cough and shortness of breath.   Cardiovascular: Negative for chest pain and leg swelling.  Gastrointestinal: Negative for  abdominal pain and diarrhea.  Endocrine: Negative for polydipsia and polyphagia.  Skin: Negative for color change.  Neurological: Negative for dizziness and weakness.  Psychiatric/Behavioral: Negative for behavioral problems and confusion.       Objective:   Physical Exam Vitals reviewed.  Constitutional:      General: She is not in acute distress. HENT:     Head: Normocephalic and atraumatic.  Eyes:     General:        Right eye: No discharge.        Left eye: No discharge.  Neck:     Trachea: No tracheal deviation.  Cardiovascular:     Rate and Rhythm: Normal rate and regular rhythm.     Heart sounds: Normal heart sounds. No murmur.  Pulmonary:     Effort: Pulmonary effort is normal. No respiratory distress.     Breath sounds: Normal  breath sounds.  Lymphadenopathy:     Cervical: No cervical adenopathy.  Skin:    General: Skin is warm and dry.  Neurological:     Mental Status: She is alert.     Coordination: Coordination normal.  Psychiatric:        Behavior: Behavior normal.         Assessment & Plan:  1. Ganglion cyst Ganglion cyst does not require any type of surgery currently follow-up if it gets worse we will refer to orthopedics  2. Essential hypertension, benign Blood pressure on good control continue medication minimize salt stay active try to keep weight down  3. Type 2 diabetes mellitus without complication, without long-term current use of insulin (HCC) Diabetes actually looks good minimize starches in diet avoid sugary drinks continue medications  4. Other hyperlipidemia Not at goal, patient ran out of medicine, refill sent in, repeat lab work again in 6 months,  5. Localized osteoporosis, unspecified pathological fracture presence Significant osteoporosis I highly advise Reclast discussion was held regarding this she will think about it.  Patient at risk for spontaneous fractures bone density was reviewed today  Mild renal insufficiency encourage patient healthy diet plenty of fluids 30 minutes spent with patient in combination of chart preparation documentation and time spent with patient reviewing over multiple bits of information including her lab work and bone density and making clinical decisions

## 2020-01-15 ENCOUNTER — Other Ambulatory Visit: Payer: Self-pay | Admitting: Family Medicine

## 2020-03-01 ENCOUNTER — Other Ambulatory Visit: Payer: Self-pay | Admitting: *Deleted

## 2020-03-29 ENCOUNTER — Other Ambulatory Visit (HOSPITAL_COMMUNITY): Payer: Self-pay | Admitting: Family Medicine

## 2020-03-29 DIAGNOSIS — Z1231 Encounter for screening mammogram for malignant neoplasm of breast: Secondary | ICD-10-CM

## 2020-04-08 ENCOUNTER — Other Ambulatory Visit: Payer: Self-pay | Admitting: Family Medicine

## 2020-04-19 LAB — HM DIABETES EYE EXAM

## 2020-04-20 ENCOUNTER — Other Ambulatory Visit: Payer: Self-pay

## 2020-04-20 ENCOUNTER — Ambulatory Visit (HOSPITAL_COMMUNITY): Payer: Medicare HMO

## 2020-04-20 ENCOUNTER — Ambulatory Visit (HOSPITAL_COMMUNITY)
Admission: RE | Admit: 2020-04-20 | Discharge: 2020-04-20 | Disposition: A | Discharge status: Home / Self Care | Payer: Medicare HMO | Source: Ambulatory Visit | Attending: Family Medicine | Admitting: Family Medicine

## 2020-04-20 DIAGNOSIS — Z1231 Encounter for screening mammogram for malignant neoplasm of breast: Secondary | ICD-10-CM | POA: Diagnosis not present

## 2020-04-25 ENCOUNTER — Other Ambulatory Visit (HOSPITAL_COMMUNITY): Payer: Self-pay | Admitting: Family Medicine

## 2020-04-25 DIAGNOSIS — R928 Other abnormal and inconclusive findings on diagnostic imaging of breast: Secondary | ICD-10-CM

## 2020-05-01 ENCOUNTER — Ambulatory Visit (HOSPITAL_COMMUNITY)
Admission: RE | Admit: 2020-05-01 | Discharge: 2020-05-01 | Disposition: A | Discharge status: Home / Self Care | Payer: Medicare HMO | Source: Ambulatory Visit | Attending: Family Medicine | Admitting: Family Medicine

## 2020-05-01 ENCOUNTER — Other Ambulatory Visit: Payer: Self-pay

## 2020-05-01 DIAGNOSIS — R928 Other abnormal and inconclusive findings on diagnostic imaging of breast: Secondary | ICD-10-CM | POA: Diagnosis present

## 2020-05-15 ENCOUNTER — Telehealth: Payer: Self-pay | Admitting: Family Medicine

## 2020-05-15 DIAGNOSIS — E785 Hyperlipidemia, unspecified: Secondary | ICD-10-CM

## 2020-05-15 DIAGNOSIS — E119 Type 2 diabetes mellitus without complications: Secondary | ICD-10-CM

## 2020-05-15 DIAGNOSIS — Z79899 Other long term (current) drug therapy: Secondary | ICD-10-CM

## 2020-05-15 NOTE — Telephone Encounter (Signed)
Patient is wanting to know if she needs labs for her 6 month follow up in September.Please advise

## 2020-05-15 NOTE — Telephone Encounter (Signed)
Last labs 12/12/19 lipid, liver, bmp, a1c, microalb urine

## 2020-05-18 NOTE — Telephone Encounter (Signed)
Lipid, metabolic 7, A1c Diabetes, hyperlipidemia

## 2020-05-18 NOTE — Telephone Encounter (Signed)
Blood work ordered in Epic. Patient notified. 

## 2020-06-20 LAB — HEMOGLOBIN A1C
Est. average glucose Bld gHb Est-mCnc: 140 mg/dL
Hgb A1c MFr Bld: 6.5 % — ABNORMAL HIGH (ref 4.8–5.6)

## 2020-06-20 LAB — LIPID PANEL
Chol/HDL Ratio: 2.5 ratio (ref 0.0–4.4)
Cholesterol, Total: 164 mg/dL (ref 100–199)
HDL: 66 mg/dL (ref >39)
LDL Chol Calc (NIH): 81 mg/dL (ref 0–99)
Triglycerides: 90 mg/dL (ref 0–149)
VLDL Cholesterol Cal: 17 mg/dL (ref 5–40)

## 2020-06-20 LAB — BASIC METABOLIC PANEL
BUN/Creatinine Ratio: 10 — ABNORMAL LOW (ref 12–28)
BUN: 11 mg/dL (ref 8–27)
CO2: 22 mmol/L (ref 20–29)
Calcium: 10 mg/dL (ref 8.7–10.3)
Chloride: 105 mmol/L (ref 96–106)
Creatinine, Ser: 1.05 mg/dL — ABNORMAL HIGH (ref 0.57–1.00)
GFR calc Af Amer: 62 mL/min/{1.73_m2} (ref >59)
GFR calc non Af Amer: 54 mL/min/{1.73_m2} — ABNORMAL LOW (ref >59)
Glucose: 124 mg/dL — ABNORMAL HIGH (ref 65–99)
Potassium: 3.9 mmol/L (ref 3.5–5.2)
Sodium: 144 mmol/L (ref 134–144)

## 2020-06-27 ENCOUNTER — Ambulatory Visit (INDEPENDENT_AMBULATORY_CARE_PROVIDER_SITE_OTHER): Payer: Medicare HMO | Admitting: Family Medicine

## 2020-06-27 ENCOUNTER — Other Ambulatory Visit: Payer: Self-pay

## 2020-06-27 ENCOUNTER — Encounter: Payer: Self-pay | Admitting: Family Medicine

## 2020-06-27 VITALS — BP 136/84 | HR 69 | Temp 96.5°F | Wt 191.4 lb

## 2020-06-27 DIAGNOSIS — Z23 Encounter for immunization: Secondary | ICD-10-CM

## 2020-06-27 DIAGNOSIS — E1169 Type 2 diabetes mellitus with other specified complication: Secondary | ICD-10-CM | POA: Diagnosis not present

## 2020-06-27 DIAGNOSIS — E785 Hyperlipidemia, unspecified: Secondary | ICD-10-CM | POA: Diagnosis not present

## 2020-06-27 DIAGNOSIS — I10 Essential (primary) hypertension: Secondary | ICD-10-CM

## 2020-06-27 MED ORDER — POTASSIUM CHLORIDE CRYS ER 20 MEQ PO TBCR: EXTENDED_RELEASE_TABLET | ORAL | 1 refills | Status: DC

## 2020-06-27 MED ORDER — GLIPIZIDE 5 MG PO TABS: ORAL_TABLET | ORAL | 1 refills | Status: DC

## 2020-06-27 MED ORDER — ONETOUCH DELICA LANCETS 33G MISC: 1 refills | Status: DC

## 2020-06-27 MED ORDER — ROSUVASTATIN CALCIUM 40 MG PO TABS: ORAL_TABLET | ORAL | 1 refills | Status: DC

## 2020-06-27 MED ORDER — AMLODIPINE BESYLATE 5 MG PO TABS: ORAL_TABLET | ORAL | 1 refills | Status: DC

## 2020-06-27 MED ORDER — METFORMIN HCL 500 MG PO TABS: ORAL_TABLET | ORAL | 1 refills | Status: DC

## 2020-06-27 NOTE — Progress Notes (Addendum)
Subjective:    Patient ID: Tracy Spencer, female    DOB: [birth year], 69 y.o.   MRN: [id number]  HPI Pt here for follow up. Pt states blood pressure has been good. Sugars have been good also. Pt checking sugars 1-2 times a day. Taking meds as prescribed.  Feeling ok Eating healthy Results for orders placed or performed in visit on 05/15/20  Lipid panel  Result Value Ref Range   Cholesterol, Total 164 100 - 199 mg/dL   Triglycerides 90 0 - 149 mg/dL   HDL 66 >60 mg/dL   VLDL Cholesterol Cal 17 5 - 40 mg/dL   LDL Chol Calc (NIH) 81 0 - 99 mg/dL   Chol/HDL Ratio 2.5 0.0 - 4.4 ratio  Basic metabolic panel  Result Value Ref Range   Glucose 124 (H) 65 - 99 mg/dL   BUN 11 8 - 27 mg/dL   Creatinine, Ser 1.09 (H) 0.57 - 1.00 mg/dL   GFR calc non Af Amer 54 (L) >59 mL/min/1.73   GFR calc Af Amer 62 >59 mL/min/1.73   BUN/Creatinine Ratio 10 (L) 12 - 28   Sodium 144 134 - 144 mmol/L   Potassium 3.9 3.5 - 5.2 mmol/L   Chloride 105 96 - 106 mmol/L   CO2 22 20 - 29 mmol/L   Calcium 10.0 8.7 - 10.3 mg/dL  Hemoglobin N2T  Result Value Ref Range   Hgb A1c MFr Bld 6.5 (H) 4.8 - 5.6 %   Est. average glucose Bld gHb Est-mCnc 140 mg/dL     Review of Systems  Constitutional: Negative for activity change, appetite change and fatigue.  HENT: Negative for congestion and rhinorrhea.   Respiratory: Negative for cough and shortness of breath.   Cardiovascular: Negative for chest pain and leg swelling.  Gastrointestinal: Negative for abdominal pain and diarrhea.  Endocrine: Negative for polydipsia and polyphagia.  Skin: Negative for color change.  Neurological: Negative for dizziness and weakness.  Psychiatric/Behavioral: Negative for behavioral problems and confusion.       Objective:   Physical Exam Vitals reviewed.  Constitutional:      General: She is not in acute distress. HENT:     Head: Normocephalic and atraumatic.  Eyes:     General:        Right eye: No discharge.         Left eye: No discharge.  Neck:     Trachea: No tracheal deviation.  Cardiovascular:     Rate and Rhythm: Normal rate and regular rhythm.     Heart sounds: Normal heart sounds. No murmur heard.   Pulmonary:     Effort: Pulmonary effort is normal. No respiratory distress.     Breath sounds: Normal breath sounds.  Lymphadenopathy:     Cervical: No cervical adenopathy.  Skin:    General: Skin is warm and dry.  Neurological:     Mental Status: She is alert.     Coordination: Coordination normal.  Psychiatric:        Behavior: Behavior normal.    Results for orders placed or performed in visit on 05/15/20  Lipid panel  Result Value Ref Range   Cholesterol, Total 164 100 - 199 mg/dL   Triglycerides 90 0 - 149 mg/dL   HDL 66 >55 mg/dL   VLDL Cholesterol Cal 17 5 - 40 mg/dL   LDL Chol Calc (NIH) 81 0 - 99 mg/dL   Chol/HDL Ratio 2.5 0.0 - 4.4 ratio  Basic metabolic panel  Result  Value Ref Range   Glucose 124 (H) 65 - 99 mg/dL   BUN 11 8 - 27 mg/dL   Creatinine, Ser 7.26 (H) 0.57 - 1.00 mg/dL   GFR calc non Af Amer 54 (L) >59 mL/min/1.73   GFR calc Af Amer 62 >59 mL/min/1.73   BUN/Creatinine Ratio 10 (L) 12 - 28   Sodium 144 134 - 144 mmol/L   Potassium 3.9 3.5 - 5.2 mmol/L   Chloride 105 96 - 106 mmol/L   CO2 22 20 - 29 mmol/L   Calcium 10.0 8.7 - 10.3 mg/dL  Hemoglobin O0B  Result Value Ref Range   Hgb A1c MFr Bld 6.5 (H) 4.8 - 5.6 %   Est. average glucose Bld gHb Est-mCnc 140 mg/dL    Fall Risk  02/15/9740 08/03/2019 08/24/2018 08/24/2018 02/25/2017  Falls in the past year? 0 0 0 0 No  Number falls in past yr: - - - 0 -  Injury with Fall? - - - 0 -  Follow up Falls evaluation completed Falls evaluation completed - Education provided -         Assessment & Plan:  1. Essential hypertension, benign Blood pressure good control continue current measures watch diet minimize salt in diet  2. Hyperlipidemia associated with type 2 diabetes mellitus (HCC) LDL reasonably  good continue current measures.  Continue statin.  Watch diet  Moderate obesity patient was encouraged to try to lose weight by watching diet staying physically active  3. Need for vaccination Flu shot today high-dose - Flu Vaccine QUAD High Dose(Fluad)  I recommend for patient to watch her glucose closely.  If she starts having low sugar spells more often she needs to stop her morning glipizide she may continue the evening 1.  Should be noted that the patient has had a couple low sugar spells that she was concerned about but these were minimally low I have encouraged her to eat a snack midafternoon

## 2020-06-27 NOTE — Patient Instructions (Addendum)
Results for orders placed or performed in visit on 05/15/20  Lipid panel  Result Value Ref Range   Cholesterol, Total 164 100 - 199 mg/dL   Triglycerides 90 0 - 149 mg/dL   HDL 66 >60 mg/dL   VLDL Cholesterol Cal 17 5 - 40 mg/dL   LDL Chol Calc (NIH) 81 0 - 99 mg/dL   Chol/HDL Ratio 2.5 0.0 - 4.4 ratio  Basic metabolic panel  Result Value Ref Range   Glucose 124 (H) 65 - 99 mg/dL   BUN 11 8 - 27 mg/dL   Creatinine, Ser 4.54 (H) 0.57 - 1.00 mg/dL   GFR calc non Af Amer 54 (L) >59 mL/min/1.73   GFR calc Af Amer 62 >59 mL/min/1.73   BUN/Creatinine Ratio 10 (L) 12 - 28   Sodium 144 134 - 144 mmol/L   Potassium 3.9 3.5 - 5.2 mmol/L   Chloride 105 96 - 106 mmol/L   CO2 22 20 - 29 mmol/L   Calcium 10.0 8.7 - 10.3 mg/dL  Hemoglobin U9W  Result Value Ref Range   Hgb A1c MFr Bld 6.5 (H) 4.8 - 5.6 %   Est. average glucose Bld gHb Est-mCnc 140 mg/dL    DASH Eating Plan DASH stands for "Dietary Approaches to Stop Hypertension." The DASH eating plan is a healthy eating plan that has been shown to reduce high blood pressure (hypertension). It may also reduce your risk for type 2 diabetes, heart disease, and stroke. The DASH eating plan may also help with weight loss. What are tips for following this plan?  General guidelines  Avoid eating more than 2,300 mg (milligrams) of salt (sodium) a day. If you have hypertension, you may need to reduce your sodium intake to 1,500 mg a day.  Limit alcohol intake to no more than 1 drink a day for nonpregnant women and 2 drinks a day for men. One drink equals 12 oz of beer, 5 oz of wine, or 1 oz of hard liquor.  Work with your health care provider to maintain a healthy body weight or to lose weight. Ask what an ideal weight is for you.  Get at least 30 minutes of exercise that causes your heart to beat faster (aerobic exercise) most days of the week. Activities may include walking, swimming, or biking.  Work with your health care provider or diet and  nutrition specialist (dietitian) to adjust your eating plan to your individual calorie needs. Reading food labels   Check food labels for the amount of sodium per serving. Choose foods with less than 5 percent of the Daily Value of sodium. Generally, foods with less than 300 mg of sodium per serving fit into this eating plan.  To find whole grains, look for the word "whole" as the first word in the ingredient list. Shopping  Buy products labeled as "low-sodium" or "no salt added."  Buy fresh foods. Avoid canned foods and premade or frozen meals. Cooking  Avoid adding salt when cooking. Use salt-free seasonings or herbs instead of table salt or sea salt. Check with your health care provider or pharmacist before using salt substitutes.  Do not fry foods. Cook foods using healthy methods such as baking, boiling, grilling, and broiling instead.  Cook with heart-healthy oils, such as olive, canola, soybean, or sunflower oil. Meal planning  Eat a balanced diet that includes: ? 5 or more servings of fruits and vegetables each day. At each meal, try to fill half of your plate with fruits and  vegetables. ? Up to 6-8 servings of whole grains each day. ? Less than 6 oz of lean meat, poultry, or fish each day. A 3-oz serving of meat is about the same size as a deck of cards. One egg equals 1 oz. ? 2 servings of low-fat dairy each day. ? A serving of nuts, seeds, or beans 5 times each week. ? Heart-healthy fats. Healthy fats called Omega-3 fatty acids are found in foods such as flaxseeds and coldwater fish, like sardines, salmon, and mackerel.  Limit how much you eat of the following: ? Canned or prepackaged foods. ? Food that is high in trans fat, such as fried foods. ? Food that is high in saturated fat, such as fatty meat. ? Sweets, desserts, sugary drinks, and other foods with added sugar. ? Full-fat dairy products.  Do not salt foods before eating.  Try to eat at least 2 vegetarian  meals each week.  Eat more home-cooked food and less restaurant, buffet, and fast food.  When eating at a restaurant, ask that your food be prepared with less salt or no salt, if possible. What foods are recommended? The items listed may not be a complete list. Talk with your dietitian about what dietary choices are best for you. Grains Whole-grain or whole-wheat bread. Whole-grain or whole-wheat pasta. Brown rice. Orpah Cobb. Bulgur. Whole-grain and low-sodium cereals. Pita bread. Low-fat, low-sodium crackers. Whole-wheat flour tortillas. Vegetables Fresh or frozen vegetables (raw, steamed, roasted, or grilled). Low-sodium or reduced-sodium tomato and vegetable juice. Low-sodium or reduced-sodium tomato sauce and tomato paste. Low-sodium or reduced-sodium canned vegetables. Fruits All fresh, dried, or frozen fruit. Canned fruit in natural juice (without added sugar). Meat and other protein foods Skinless chicken or [place]. Ground chicken or [place]. Pork with fat trimmed off. Fish and seafood. Egg whites. Dried beans, peas, or lentils. Unsalted nuts, nut butters, and seeds. Unsalted canned beans. Lean cuts of beef with fat trimmed off. Low-sodium, lean deli meat. Dairy Low-fat (1%) or fat-free (skim) milk. Fat-free, low-fat, or reduced-fat cheeses. Nonfat, low-sodium ricotta or cottage cheese. Low-fat or nonfat yogurt. Low-fat, low-sodium cheese. Fats and oils Soft margarine without trans fats. Vegetable oil. Low-fat, reduced-fat, or light mayonnaise and salad dressings (reduced-sodium). Canola, safflower, olive, soybean, and sunflower oils. Avocado. Seasoning and other foods Herbs. Spices. Seasoning mixes without salt. Unsalted popcorn and pretzels. Fat-free sweets. What foods are not recommended? The items listed may not be a complete list. Talk with your dietitian about what dietary choices are best for you. Grains Baked goods made with fat, such as croissants, muffins, or some  breads. Dry pasta or rice meal packs. Vegetables Creamed or fried vegetables. Vegetables in a cheese sauce. Regular canned vegetables (not low-sodium or reduced-sodium). Regular canned tomato sauce and paste (not low-sodium or reduced-sodium). Regular tomato and vegetable juice (not low-sodium or reduced-sodium). Rosita Fire. Olives. Fruits Canned fruit in a light or heavy syrup. Fried fruit. Fruit in cream or butter sauce. Meat and other protein foods Fatty cuts of meat. Ribs. Fried meat. Tomasa Blase. Sausage. Bologna and other processed lunch meats. Salami. Fatback. Hotdogs. Bratwurst. Salted nuts and seeds. Canned beans with added salt. Canned or smoked fish. Whole eggs or egg yolks. Chicken or [place] with skin. Dairy Whole or 2% milk, cream, and half-and-half. Whole or full-fat cream cheese. Whole-fat or sweetened yogurt. Full-fat cheese. Nondairy creamers. Whipped toppings. Processed cheese and cheese spreads. Fats and oils Butter. Stick margarine. Lard. Shortening. Ghee. Bacon fat. Tropical oils, such as coconut, palm kernel, or  palm oil. Seasoning and other foods Salted popcorn and pretzels. Onion salt, garlic salt, seasoned salt, table salt, and sea salt. Worcestershire sauce. Tartar sauce. Barbecue sauce. Teriyaki sauce. Soy sauce, including reduced-sodium. Steak sauce. Canned and packaged gravies. Fish sauce. Oyster sauce. Cocktail sauce. Horseradish that you find on the shelf. Ketchup. Mustard. Meat flavorings and tenderizers. Bouillon cubes. Hot sauce and Tabasco sauce. Premade or packaged marinades. Premade or packaged taco seasonings. Relishes. Regular salad dressings. Where to find more information:  National Heart, Lung, and Blood Institute: PopSteam.is  American Heart Association: www.heart.org Summary  The DASH eating plan is a healthy eating plan that has been shown to reduce high blood pressure (hypertension). It may also reduce your risk for type 2 diabetes, heart disease, and  stroke.  With the DASH eating plan, you should limit salt (sodium) intake to 2,300 mg a day. If you have hypertension, you may need to reduce your sodium intake to 1,500 mg a day.  When on the DASH eating plan, aim to eat more fresh fruits and vegetables, whole grains, lean proteins, low-fat dairy, and heart-healthy fats.  Work with your health care provider or diet and nutrition specialist (dietitian) to adjust your eating plan to your individual calorie needs. This information is not intended to replace advice given to you by your health care provider. Make sure you discuss any questions you have with your health care provider. Document Revised: 09/11/2017 Document Reviewed: 09/22/2016 Elsevier Patient Education  2020 [employer].

## 2020-07-18 ENCOUNTER — Other Ambulatory Visit: Payer: Self-pay | Admitting: Family Medicine

## 2020-08-04 ENCOUNTER — Other Ambulatory Visit: Payer: Self-pay | Admitting: Family Medicine

## 2020-08-28 ENCOUNTER — Other Ambulatory Visit: Payer: Self-pay | Admitting: Family Medicine

## 2020-09-12 ENCOUNTER — Other Ambulatory Visit: Payer: Self-pay | Admitting: Family Medicine

## 2020-09-19 ENCOUNTER — Telehealth: Payer: Self-pay

## 2020-09-19 DIAGNOSIS — N6310 Unspecified lump in the right breast, unspecified quadrant: Secondary | ICD-10-CM

## 2020-09-19 NOTE — Telephone Encounter (Signed)
Due for diagnostic mammo and right breast ultrasound after Jan 20th. Pt can go any day. Is it ok to put in orders and schedule for pt.

## 2020-09-19 NOTE — Telephone Encounter (Signed)
Pt needs a referral for a mammogram sent to [name] pt tried to schedule said primary care dr send referral.   Pt call back [phone]

## 2020-09-19 NOTE — Telephone Encounter (Signed)
Please put this in Please flag this to make sure that the results are sent to me and that the patient follows through with doing so

## 2020-09-20 NOTE — Telephone Encounter (Addendum)
Patient scheduled Tuesday January 25 at 10:25 am at [facility].  Left message to return call to notify patient.

## 2020-09-20 NOTE — Telephone Encounter (Signed)
Patient notified of appointment and verbalized understanding.

## 2020-11-02 ENCOUNTER — Other Ambulatory Visit: Payer: Self-pay

## 2020-11-02 ENCOUNTER — Encounter: Payer: Self-pay | Admitting: Obstetrics and Gynecology

## 2020-11-02 ENCOUNTER — Ambulatory Visit (INDEPENDENT_AMBULATORY_CARE_PROVIDER_SITE_OTHER): Payer: Medicare HMO | Admitting: Obstetrics and Gynecology

## 2020-11-02 DIAGNOSIS — N898 Other specified noninflammatory disorders of vagina: Secondary | ICD-10-CM | POA: Diagnosis not present

## 2020-11-02 MED ORDER — METRONIDAZOLE 0.75 % VA GEL: 1.0000 | Freq: Every day | VAGINAL | 1 refills | Status: DC

## 2020-11-02 NOTE — Progress Notes (Signed)
Ms Boys presents with a few weeks history of vaginal discharge with odor and external itching. Not sexual active. No recent exposure or change in detergents either She did have some swelling but reports this has resolved  S/P Hysterectomy  PE AF VSS Lungs clear Heart RRR Abd soft + BS non tender GU nl EGBUS, white/yellow discharge, mucosa atrophic no masses or tenderness  A/P Probable BV Will treat with Metrogel. A & D onit for external itching F/U PRN or if Sx do not resolve or worsen

## 2020-11-06 ENCOUNTER — Other Ambulatory Visit: Payer: Self-pay

## 2020-11-06 ENCOUNTER — Ambulatory Visit (HOSPITAL_COMMUNITY): Admission: RE | Admit: 2020-11-06 | Payer: Medicare HMO | Source: Ambulatory Visit

## 2020-11-06 ENCOUNTER — Ambulatory Visit (HOSPITAL_COMMUNITY)
Admission: RE | Admit: 2020-11-06 | Discharge: 2020-11-06 | Disposition: A | Discharge status: Home / Self Care | Payer: Medicare HMO | Source: Ambulatory Visit | Attending: Family Medicine | Admitting: Family Medicine

## 2020-11-06 DIAGNOSIS — N6311 Unspecified lump in the right breast, upper outer quadrant: Secondary | ICD-10-CM | POA: Diagnosis not present

## 2020-11-06 DIAGNOSIS — N6310 Unspecified lump in the right breast, unspecified quadrant: Secondary | ICD-10-CM | POA: Diagnosis present

## 2020-12-13 ENCOUNTER — Telehealth: Payer: Self-pay

## 2020-12-13 DIAGNOSIS — E119 Type 2 diabetes mellitus without complications: Secondary | ICD-10-CM

## 2020-12-13 DIAGNOSIS — I10 Essential (primary) hypertension: Secondary | ICD-10-CM

## 2020-12-13 DIAGNOSIS — E785 Hyperlipidemia, unspecified: Secondary | ICD-10-CM

## 2020-12-13 DIAGNOSIS — Z79899 Other long term (current) drug therapy: Secondary | ICD-10-CM

## 2020-12-13 DIAGNOSIS — E1169 Type 2 diabetes mellitus with other specified complication: Secondary | ICD-10-CM

## 2020-12-13 NOTE — Telephone Encounter (Signed)
Pt has appt on March 15th and wants to know if she needs blood work ordered?  Pt call back [phone]

## 2020-12-13 NOTE — Telephone Encounter (Signed)
Lab orders placed and pt is aware 

## 2020-12-13 NOTE — Telephone Encounter (Signed)
Last completed labs 06/19/20 A1C, BMET and Lipid. Please advise. Thank you

## 2020-12-13 NOTE — Telephone Encounter (Signed)
A1c, CMP, urine ACR, lipid Diabetes hyperlipidemia

## 2020-12-18 LAB — COMPREHENSIVE METABOLIC PANEL
ALT: 12 IU/L (ref 0–32)
AST: 14 IU/L (ref 0–40)
Albumin/Globulin Ratio: 1.8 (ref 1.2–2.2)
Albumin: 4.6 g/dL (ref 3.7–4.7)
Alkaline Phosphatase: 87 IU/L (ref 44–121)
BUN/Creatinine Ratio: 8 — ABNORMAL LOW (ref 12–28)
BUN: 9 mg/dL (ref 8–27)
Bilirubin Total: 0.4 mg/dL (ref 0.0–1.2)
CO2: 23 mmol/L (ref 20–29)
Calcium: 10 mg/dL (ref 8.7–10.3)
Chloride: 102 mmol/L (ref 96–106)
Creatinine, Ser: 1.09 mg/dL — ABNORMAL HIGH (ref 0.57–1.00)
Globulin, Total: 2.5 g/dL (ref 1.5–4.5)
Glucose: 136 mg/dL — ABNORMAL HIGH (ref 65–99)
Potassium: 4.3 mmol/L (ref 3.5–5.2)
Sodium: 140 mmol/L (ref 134–144)
Total Protein: 7.1 g/dL (ref 6.0–8.5)
eGFR: 54 mL/min/{1.73_m2} — ABNORMAL LOW (ref >59)

## 2020-12-18 LAB — LIPID PANEL
Chol/HDL Ratio: 2.7 ratio (ref 0.0–4.4)
Cholesterol, Total: 166 mg/dL (ref 100–199)
HDL: 62 mg/dL (ref >39)
LDL Chol Calc (NIH): 88 mg/dL (ref 0–99)
Triglycerides: 89 mg/dL (ref 0–149)
VLDL Cholesterol Cal: 16 mg/dL (ref 5–40)

## 2020-12-18 LAB — MICROALBUMIN / CREATININE URINE RATIO
Creatinine, Urine: 200.5 mg/dL
Microalb/Creat Ratio: 30 mg/g creat — ABNORMAL HIGH (ref 0–29)
Microalbumin, Urine: 60.9 ug/mL

## 2020-12-18 LAB — HEMOGLOBIN A1C
Est. average glucose Bld gHb Est-mCnc: 146 mg/dL
Hgb A1c MFr Bld: 6.7 % — ABNORMAL HIGH (ref 4.8–5.6)

## 2020-12-25 ENCOUNTER — Encounter: Payer: Self-pay | Admitting: Family Medicine

## 2020-12-25 ENCOUNTER — Ambulatory Visit (INDEPENDENT_AMBULATORY_CARE_PROVIDER_SITE_OTHER): Payer: Medicare HMO | Admitting: Family Medicine

## 2020-12-25 ENCOUNTER — Other Ambulatory Visit: Payer: Self-pay

## 2020-12-25 VITALS — BP 138/78 | HR 75 | Temp 97.0°F | Wt 194.2 lb

## 2020-12-25 DIAGNOSIS — I10 Essential (primary) hypertension: Secondary | ICD-10-CM

## 2020-12-25 DIAGNOSIS — E785 Hyperlipidemia, unspecified: Secondary | ICD-10-CM | POA: Diagnosis not present

## 2020-12-25 DIAGNOSIS — E1169 Type 2 diabetes mellitus with other specified complication: Secondary | ICD-10-CM | POA: Diagnosis not present

## 2020-12-25 DIAGNOSIS — E119 Type 2 diabetes mellitus without complications: Secondary | ICD-10-CM | POA: Diagnosis not present

## 2020-12-25 MED ORDER — AMLODIPINE BESYLATE 5 MG PO TABS: ORAL_TABLET | ORAL | 1 refills | Status: DC

## 2020-12-25 MED ORDER — POTASSIUM CHLORIDE CRYS ER 20 MEQ PO TBCR: EXTENDED_RELEASE_TABLET | ORAL | 1 refills | Status: DC

## 2020-12-25 MED ORDER — GLIPIZIDE 5 MG PO TABS: ORAL_TABLET | ORAL | 1 refills | Status: DC

## 2020-12-25 MED ORDER — ROSUVASTATIN CALCIUM 40 MG PO TABS: ORAL_TABLET | ORAL | 0 refills | Status: DC

## 2020-12-25 MED ORDER — METFORMIN HCL 500 MG PO TABS: ORAL_TABLET | ORAL | 1 refills | Status: DC

## 2020-12-25 MED ORDER — LOSARTAN POTASSIUM 100 MG PO TABS: ORAL_TABLET | ORAL | 1 refills | Status: DC

## 2020-12-25 NOTE — Patient Instructions (Signed)
Results for orders placed or performed in visit on 12/13/20  Hemoglobin A1c  Result Value Ref Range   Hgb A1c MFr Bld 6.7 (H) 4.8 - 5.6 %   Est. average glucose Bld gHb Est-mCnc 146 mg/dL  Comprehensive Metabolic Panel (CMET)  Result Value Ref Range   Glucose 136 (H) 65 - 99 mg/dL   BUN 9 8 - 27 mg/dL   Creatinine, Ser 1.09 (H) 0.57 - 1.00 mg/dL   eGFR 54 (L) >59 mL/min/1.73   BUN/Creatinine Ratio 8 (L) 12 - 28   Sodium 140 134 - 144 mmol/L   Potassium 4.3 3.5 - 5.2 mmol/L   Chloride 102 96 - 106 mmol/L   CO2 23 20 - 29 mmol/L   Calcium 10.0 8.7 - 10.3 mg/dL   Total Protein 7.1 6.0 - 8.5 g/dL   Albumin 4.6 3.7 - 4.7 g/dL   Globulin, Total 2.5 1.5 - 4.5 g/dL   Albumin/Globulin Ratio 1.8 1.2 - 2.2   Bilirubin Total 0.4 0.0 - 1.2 mg/dL   Alkaline Phosphatase 87 44 - 121 IU/L   AST 14 0 - 40 IU/L   ALT 12 0 - 32 IU/L  Urine Microalbumin w/creat. ratio  Result Value Ref Range   Creatinine, Urine 200.5 Not Estab. mg/dL   Microalbumin, Urine 60.9 Not Estab. ug/mL   Microalb/Creat Ratio 30 (H) 0 - 29 mg/g creat  Lipid Profile  Result Value Ref Range   Cholesterol, Total 166 100 - 199 mg/dL   Triglycerides 89 0 - 149 mg/dL   HDL 62 >39 mg/dL   VLDL Cholesterol Cal 16 5 - 40 mg/dL   LDL Chol Calc (NIH) 88 0 - 99 mg/dL   Chol/HDL Ratio 2.7 0.0 - 4.4 ratio

## 2020-12-25 NOTE — Progress Notes (Signed)
Subjective:    Patient ID: Tracy Spencer, female    DOB: 06/09/1951, 70 y.o.   MRN: [id number]  Diabetes She presents for her follow-up diabetic visit. She has type 2 diabetes mellitus. There are no hypoglycemic associated symptoms. Pertinent negatives for hypoglycemia include no confusion or dizziness. There are no diabetic associated symptoms. Pertinent negatives for diabetes include no chest pain, no fatigue, no polydipsia, no polyphagia and no weakness. There are no hypoglycemic complications. There are no diabetic complications. Risk factors for coronary artery disease include hypertension. She is compliant with treatment all of the time. Her home blood glucose trend is fluctuating minimally. She does not see a podiatrist.Eye exam is current.  Hypertension This is a chronic problem. Pertinent negatives include no chest pain or shortness of breath. Risk factors for coronary artery disease include diabetes mellitus. There are no compliance problems.    Hyperlipidemia associated with type 2 diabetes mellitus (HCC) - Plan: Hemoglobin A1c, Basic Metabolic Panel (BMET), Lipid Profile  Type 2 diabetes mellitus without complication, without long-term current use of insulin (HCC) - Plan: Hemoglobin A1c, Basic Metabolic Panel (BMET), Lipid Profile  Essential hypertension, benign - Plan: Hemoglobin A1c, Basic Metabolic Panel (BMET), Lipid Profile     Review of Systems  Constitutional: Negative for activity change, appetite change and fatigue.  HENT: Negative for congestion and rhinorrhea.   Respiratory: Negative for cough and shortness of breath.   Cardiovascular: Negative for chest pain and leg swelling.  Gastrointestinal: Negative for abdominal pain and diarrhea.  Endocrine: Negative for polydipsia and polyphagia.  Skin: Negative for color change.  Neurological: Negative for dizziness and weakness.  Psychiatric/Behavioral: Negative for behavioral problems and confusion.        Objective:   Physical Exam Vitals reviewed.  Constitutional:      General: She is not in acute distress. HENT:     Head: Normocephalic and atraumatic.  Eyes:     General:        Right eye: No discharge.        Left eye: No discharge.  Neck:     Trachea: No tracheal deviation.  Cardiovascular:     Rate and Rhythm: Normal rate and regular rhythm.     Heart sounds: Normal heart sounds. No murmur heard.   Pulmonary:     Effort: Pulmonary effort is normal. No respiratory distress.     Breath sounds: Normal breath sounds.  Lymphadenopathy:     Cervical: No cervical adenopathy.  Skin:    General: Skin is warm and dry.  Neurological:     Mental Status: She is alert.     Coordination: Coordination normal.  Psychiatric:        Behavior: Behavior normal.    Fall Risk  12/25/2020 06/27/2020 12/19/2019 08/03/2019 08/24/2018  Falls in the past year? 0 0 0 0 0  Number falls in past yr: 0 - - - -  Injury with Fall? 0 - - - -  Follow up Falls evaluation completed Falls evaluation completed Falls evaluation completed Falls evaluation completed -   Depression screen [facility] 2/9 12/25/2020 06/27/2020 12/19/2019 08/24/2018 08/24/2018  Decreased Interest 0 0 0 0 0  Down, Depressed, Hopeless 0 0 0 0 0  PHQ - 2 Score 0 0 0 0 0          Assessment & Plan:  1. Hyperlipidemia associated with type 2 diabetes mellitus (HCC) Continue statin.  Healthy diet recommended.  Regular physical activity recommended.  Repeat labs again in  6 months - Hemoglobin A1c - Basic Metabolic Panel (BMET) - Lipid Profile  2. Type 2 diabetes mellitus without complication, without long-term current use of insulin (HCC) A1c decent control.  Work hard on minimizing starches stay physically active continue medication - Hemoglobin A1c - Basic Metabolic Panel (BMET) - Lipid Profile  3. Essential hypertension, benign Blood pressure good control continue medication watch diet - Hemoglobin A1c - Basic Metabolic Panel  (BMET) - Lipid Profile

## 2021-02-13 ENCOUNTER — Other Ambulatory Visit: Payer: Self-pay | Admitting: Family Medicine

## 2021-02-14 NOTE — Telephone Encounter (Signed)
Typically was a patient who is not on insulin it is a daily glucose test that is approved  Recommend going ahead with approval of this with 2 refills Non-insulin-dependent type II diabetic

## 2021-03-08 ENCOUNTER — Telehealth: Payer: Self-pay | Admitting: Family Medicine

## 2021-03-08 NOTE — Telephone Encounter (Signed)
Left message for patient to call back and schedule Medicare Annual Wellness Visit (AWV) in office.   If not able to come in office, please offer to do virtually or by telephone.   Last AWV:12/19/2015   Please schedule at anytime with Nurse Health Advisor.

## 2021-03-17 ENCOUNTER — Telehealth: Payer: Self-pay | Admitting: Family Medicine

## 2021-03-17 NOTE — Telephone Encounter (Signed)
Nurses Patient had bone density last year which showed osteoporosis This means that she is at higher risk of having hip fracture or other fractures At that time of her visit we discussed about the possibility of trying IV Reclast She was going to consider this but she never got back with [place] regarding her decision. Given the osteoporosis would she want to go ahead and move forward with the once a year Reclast infusion?  If she defers we can discuss this further at her fall office visit

## 2021-03-18 NOTE — Telephone Encounter (Signed)
Tried to call no answer

## 2021-03-19 NOTE — Telephone Encounter (Signed)
Pt contacted and states she is willing to go through with IV Reclast. Please advise. Thank you  (Pt is aware provider is out of the office until Monday)

## 2021-03-22 ENCOUNTER — Other Ambulatory Visit (HOSPITAL_COMMUNITY): Payer: Self-pay | Admitting: Family Medicine

## 2021-03-22 DIAGNOSIS — Z1231 Encounter for screening mammogram for malignant neoplasm of breast: Secondary | ICD-10-CM

## 2021-03-24 NOTE — Telephone Encounter (Signed)
May move forward with IV Reclast infusion referral to [facility] in order to help treat her ongoing osteoporosis and increased fracture risk, abnormal bone density previously

## 2021-03-25 NOTE — Telephone Encounter (Signed)
Left message to return call with short stay scheduler at [facility] [phone]

## 2021-03-26 NOTE — Telephone Encounter (Signed)
Left Message to return call with patient to ask if she had any problems tolerating the oral medication and if so what issues did she have( this information is needed for prior authorization )

## 2021-03-27 NOTE — Telephone Encounter (Signed)
Patient states that the oral medication made her sick on the stomach at times.  Patient has questions and concerns about reclast infusion:   What does it do to her body- how does it work? How long does it take?  What are possible side effects or complications?

## 2021-03-31 NOTE — Telephone Encounter (Signed)
It is a once a year infusion It works on bone metabolism to help build stronger bones Most people tolerate it well but can cause some temporary discomforts Very rare to be allergic to the medicine She should do a follow-up visit in September we can discuss in full detail at that time if she wishes

## 2021-04-01 NOTE — Telephone Encounter (Signed)
Patient has been informed per drs notes and recommendations, she would like to schedule an consult with doctor for further clarifications.

## 2021-04-04 ENCOUNTER — Ambulatory Visit: Payer: Medicare HMO | Admitting: Family Medicine

## 2021-05-03 ENCOUNTER — Other Ambulatory Visit: Payer: Self-pay

## 2021-05-03 ENCOUNTER — Ambulatory Visit (HOSPITAL_COMMUNITY)
Admission: RE | Admit: 2021-05-03 | Discharge: 2021-05-03 | Disposition: A | Discharge status: Home / Self Care | Payer: Medicare HMO | Source: Ambulatory Visit | Attending: Family Medicine | Admitting: Family Medicine

## 2021-05-03 DIAGNOSIS — Z1231 Encounter for screening mammogram for malignant neoplasm of breast: Secondary | ICD-10-CM | POA: Diagnosis not present

## 2021-05-09 ENCOUNTER — Other Ambulatory Visit: Payer: Self-pay | Admitting: *Deleted

## 2021-05-09 MED ORDER — LOSARTAN POTASSIUM 100 MG PO TABS: ORAL_TABLET | ORAL | 0 refills | Status: DC

## 2021-05-14 ENCOUNTER — Other Ambulatory Visit: Payer: Self-pay

## 2021-05-14 MED ORDER — ONETOUCH DELICA LANCETS 33G MISC: 1 refills | Status: DC

## 2021-05-25 IMAGING — MG MM DIGITAL DIAGNOSTIC UNILAT*R* W/ TOMO W/ CAD
4 series · 4 of 12 positions shown · non-contrast
Comparison: Previous exams.

CLINICAL DATA: Screening recall for possible right breast mass.

EXAM:
DIGITAL DIAGNOSTIC UNILATERAL RIGHT MAMMOGRAM WITH TOMO AND CAD;
ULTRASOUND RIGHT BREAST LIMITED

[R CC synth-2D]
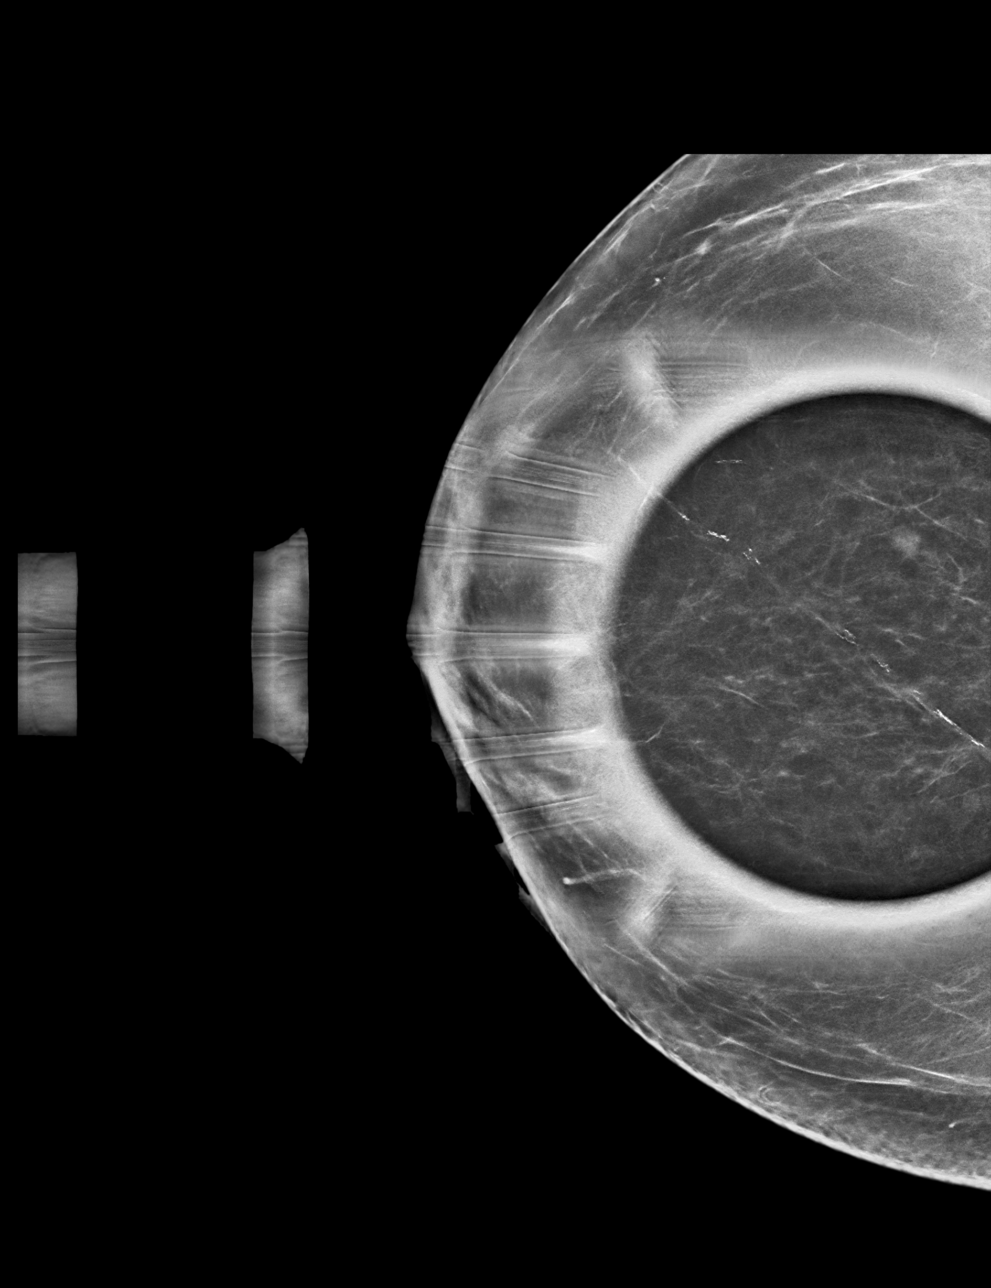

[R MLO synth-2D]
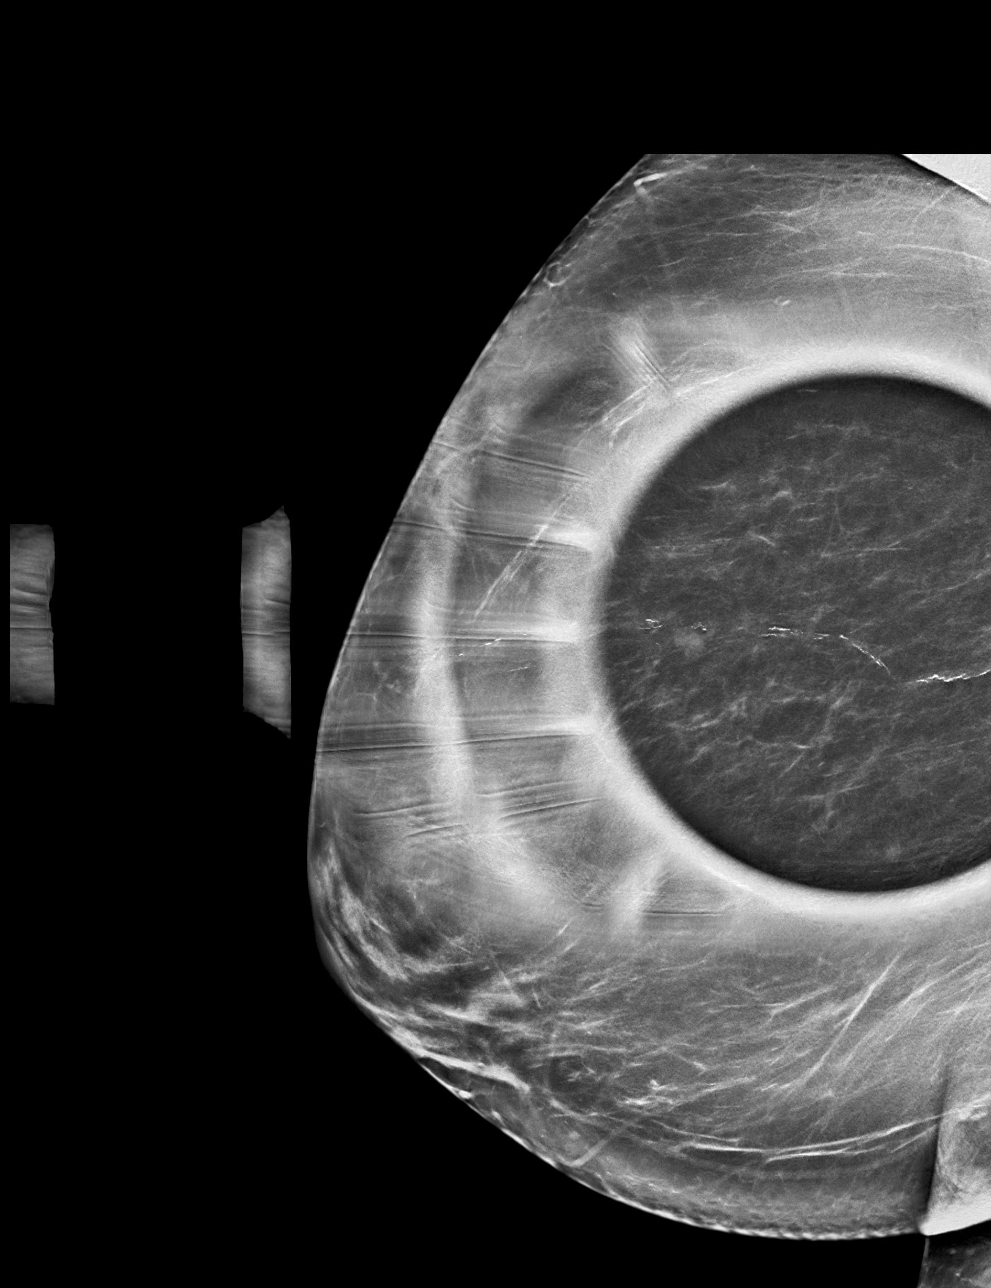

[R MLO tomo · tomo slice 36/71.0]
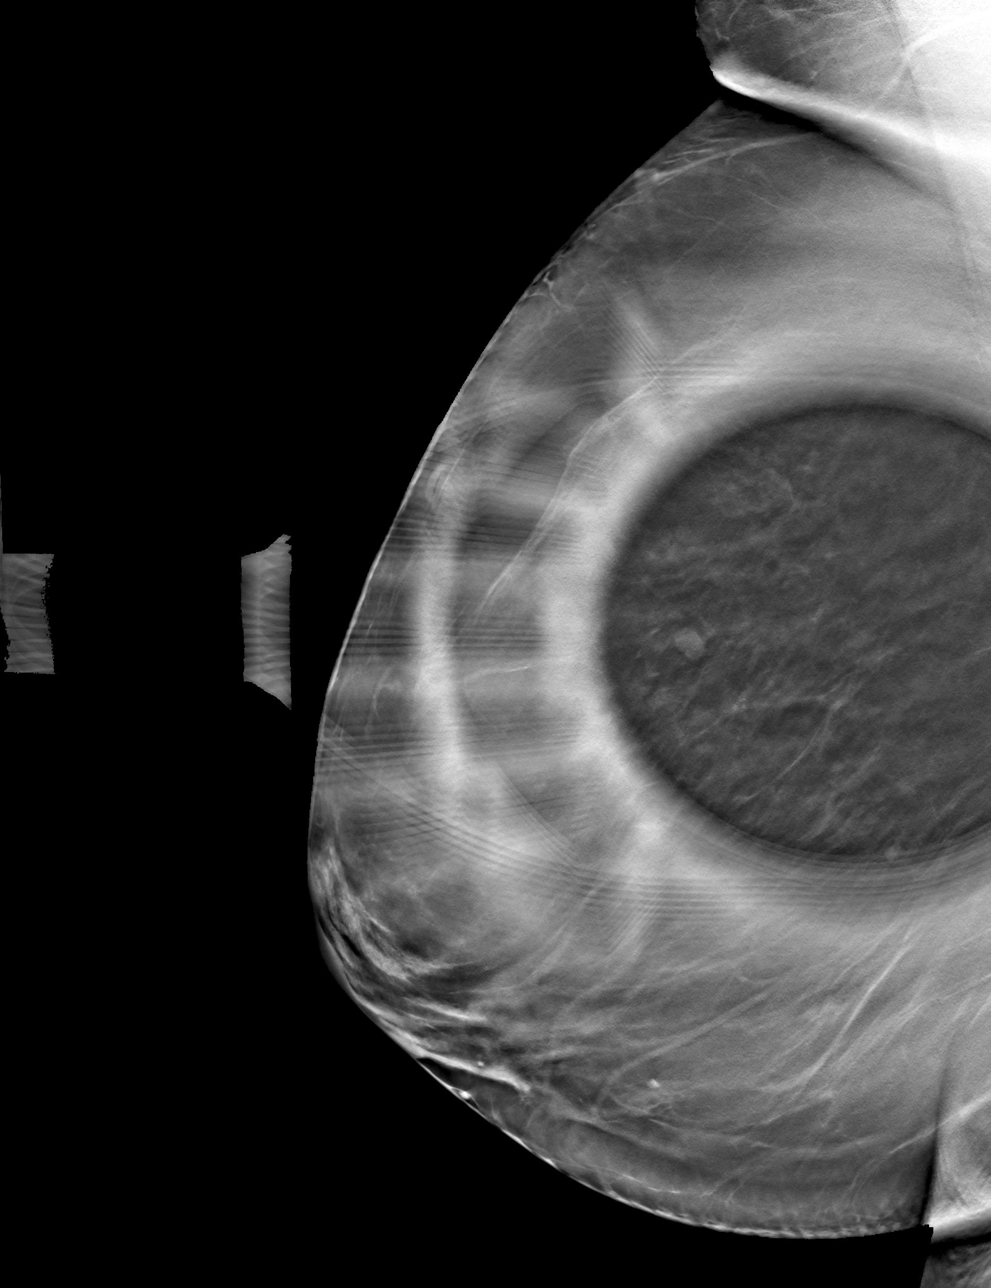

[R CC tomo · tomo slice 31/62.0]
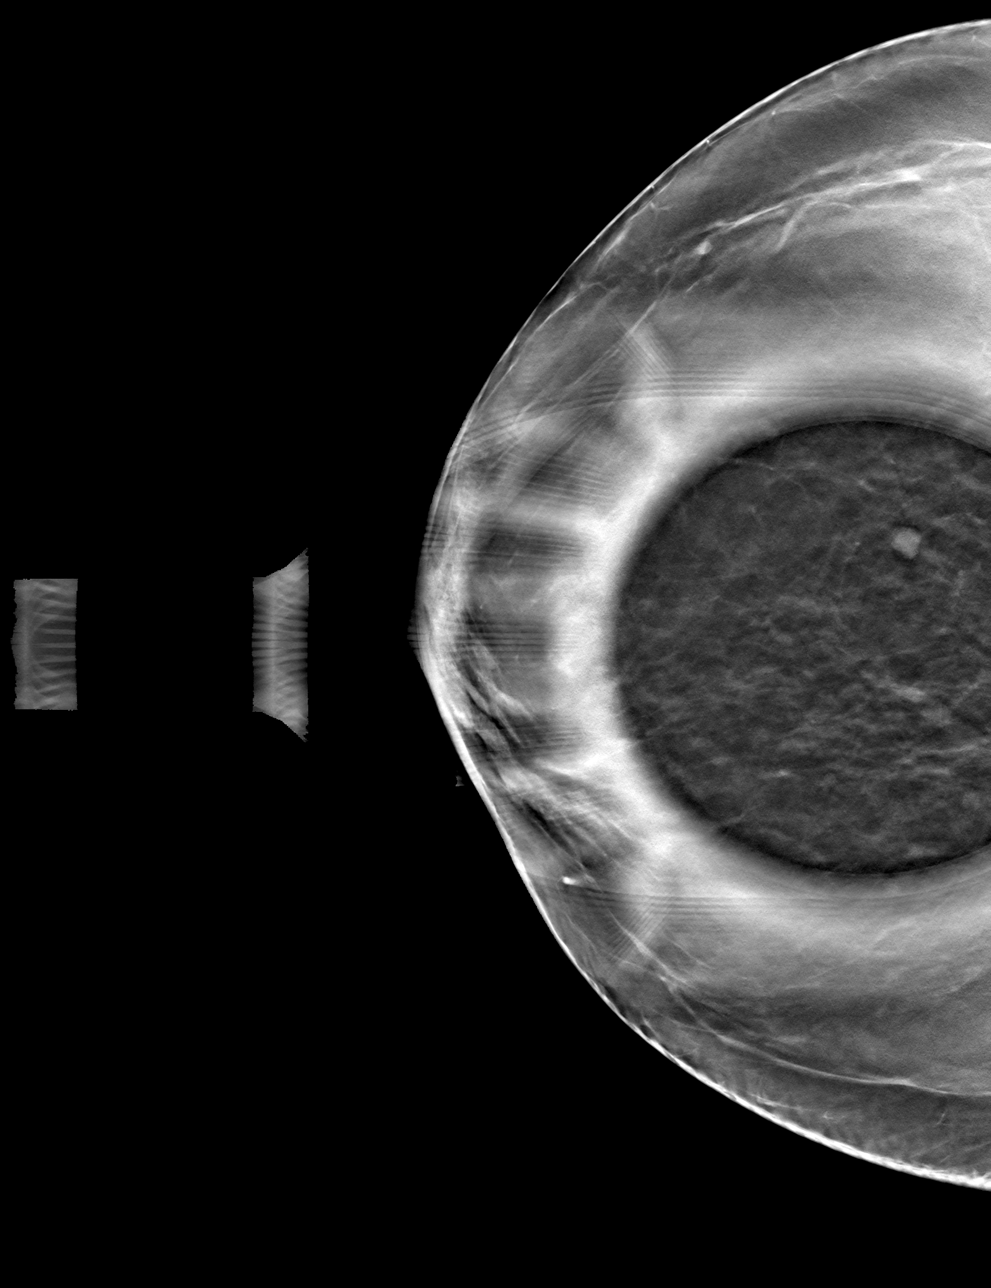

[4 of 12 positions shown; findings below may reference images not displayed]

ACR Breast Density Category b: There are scattered areas of
fibroglandular density.
FINDINGS: Spot compression tomograms were performed the central to slightly
upper right breast. There is an oval circumscribed mass measuring
approximately 0.4 cm.

Mammographic images were processed with CAD.

Targeted ultrasound of the right breast was performed. There is a
small area of fibrocystic change/cluster of microcysts at the [DATE]
position 7 cm from nipple measuring 0.4 x 0.4 x 0.5 cm. This is felt
to correspond with the mass seen in the right breast at mammography.
IMPRESSION: Probably benign right breast mass.

RECOMMENDATION:
Diagnostic mammography of the right breast with ultrasound in 6
months.

I have discussed the findings and recommendations with the patient.
If applicable, a reminder letter will be sent to the patient
regarding the next appointment.

BI-RADS CATEGORY  3: Probably benign.

## 2021-05-25 IMAGING — US US BREAST*R* LIMITED INC AXILLA
1 series · 12 of 12 positions shown · non-contrast
Comparison: Previous exams.

CLINICAL DATA: Screening recall for possible right breast mass.

EXAM:
DIGITAL DIAGNOSTIC UNILATERAL RIGHT MAMMOGRAM WITH TOMO AND CAD;
ULTRASOUND RIGHT BREAST LIMITED

[Series 1: us breast*right* limited inc axilla · 0.08mm/px · 12 of 12 slices shown]
[im 1/12]
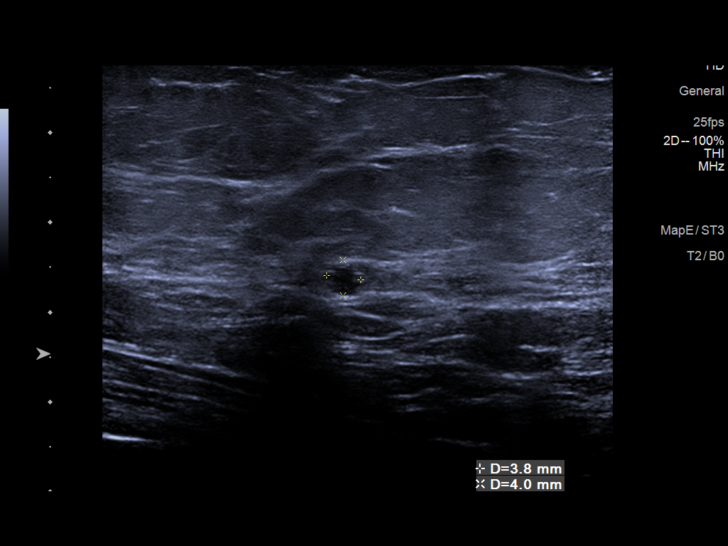
[im 2/12]
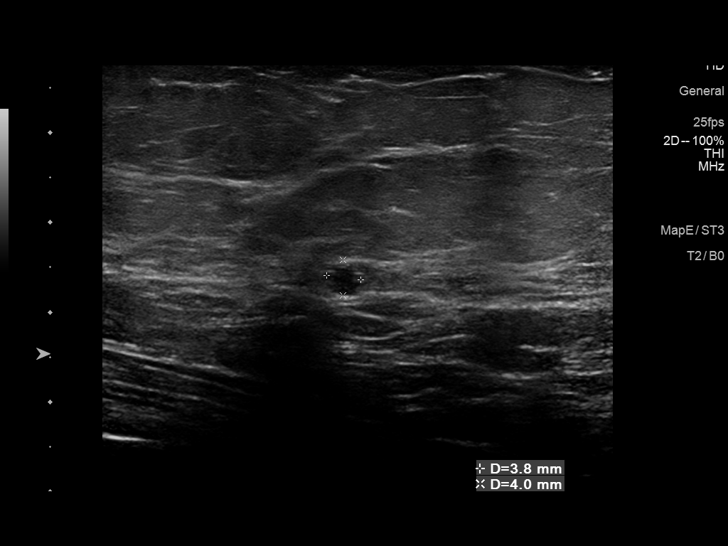
[im 3/12]
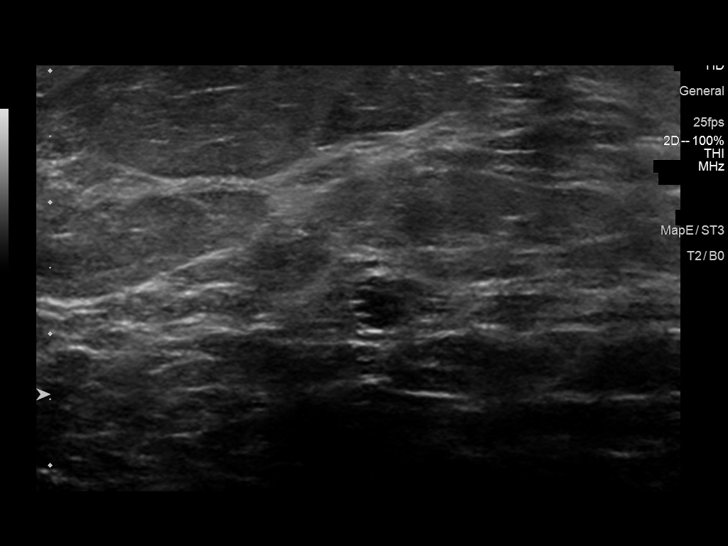
[im 4/12]
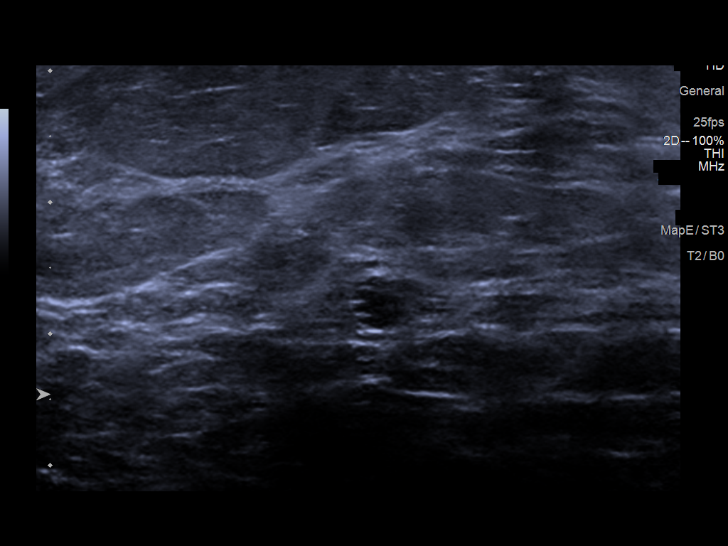
[im 5/12]
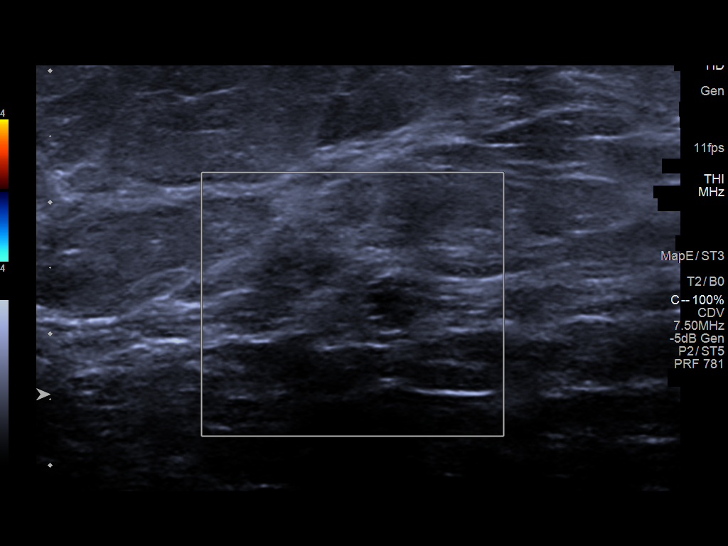
[im 6/12]
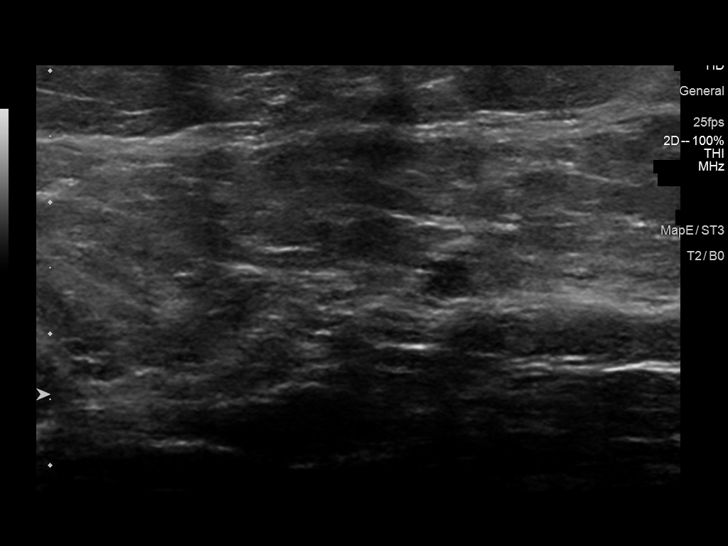
[im 7/12]
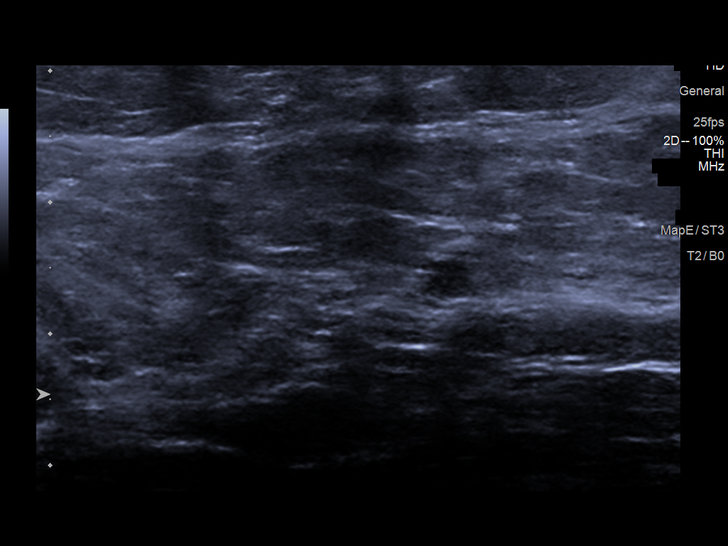
[im 8/12]
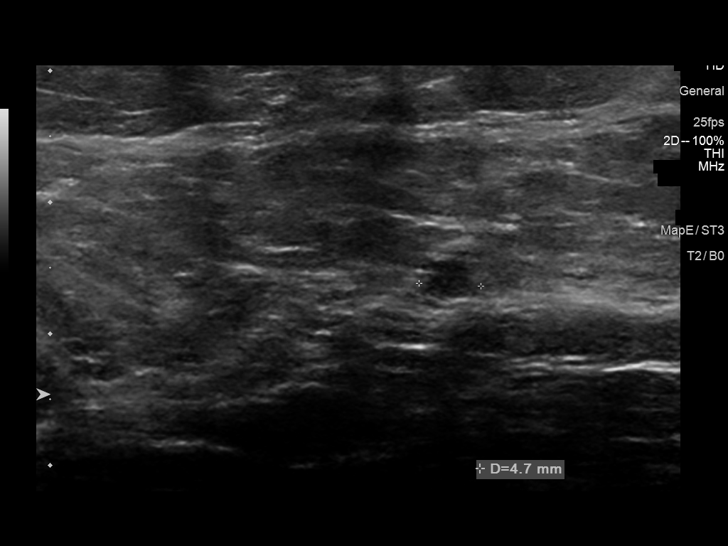
[im 9/12]
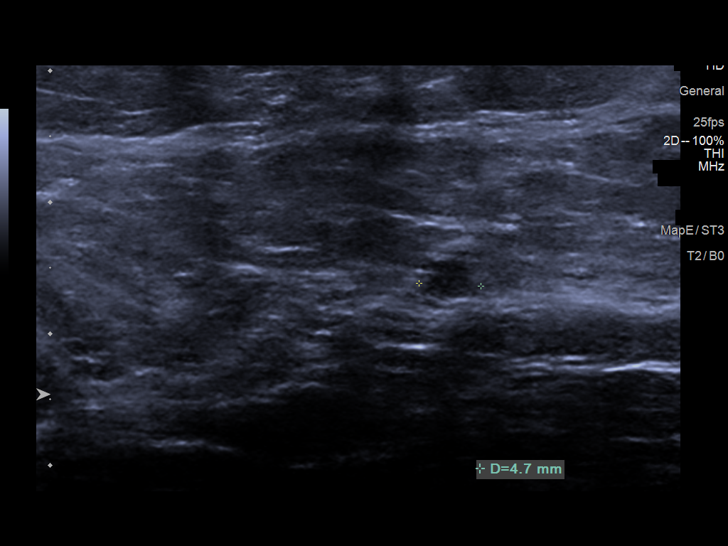
[im 10/12]
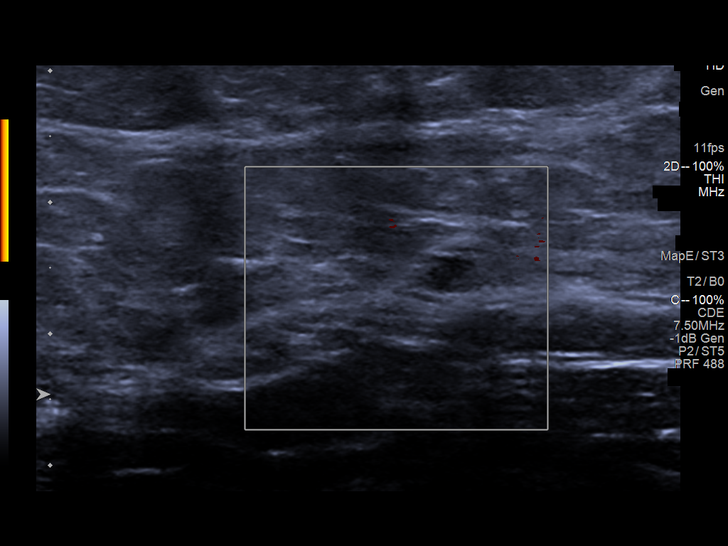
[im 11/12]
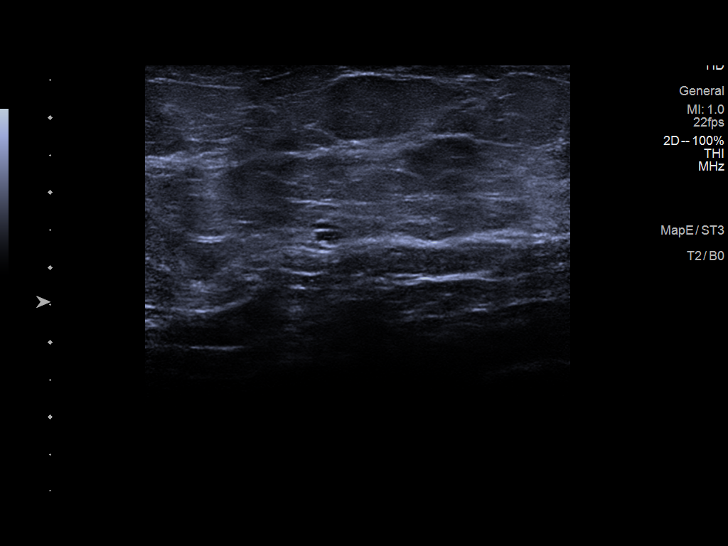
[im 12/12]
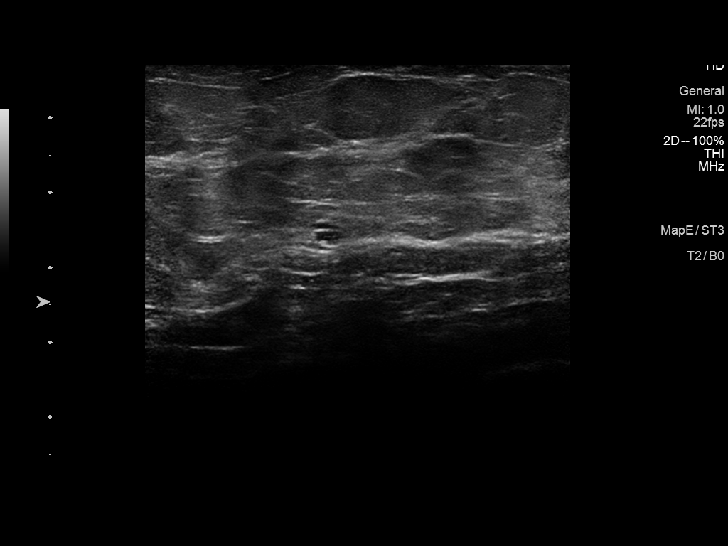

[12 of 12 positions shown; findings below may reference images not displayed]

ACR Breast Density Category b: There are scattered areas of
fibroglandular density.
FINDINGS: Spot compression tomograms were performed the central to slightly
upper right breast. There is an oval circumscribed mass measuring
approximately 0.4 cm.

Mammographic images were processed with CAD.

Targeted ultrasound of the right breast was performed. There is a
small area of fibrocystic change/cluster of microcysts at the [DATE]
position 7 cm from nipple measuring 0.4 x 0.4 x 0.5 cm. This is felt
to correspond with the mass seen in the right breast at mammography.
IMPRESSION: Probably benign right breast mass.

RECOMMENDATION:
Diagnostic mammography of the right breast with ultrasound in 6
months.

I have discussed the findings and recommendations with the patient.
If applicable, a reminder letter will be sent to the patient
regarding the next appointment.

BI-RADS CATEGORY  3: Probably benign.

## 2021-06-24 ENCOUNTER — Encounter: Payer: Self-pay | Admitting: Family Medicine

## 2021-06-24 ENCOUNTER — Other Ambulatory Visit: Payer: Self-pay

## 2021-06-24 ENCOUNTER — Ambulatory Visit (INDEPENDENT_AMBULATORY_CARE_PROVIDER_SITE_OTHER): Payer: Medicare HMO | Admitting: Family Medicine

## 2021-06-24 VITALS — BP 124/62 | HR 62 | Temp 97.3°F | Ht 65.0 in | Wt 188.0 lb

## 2021-06-24 DIAGNOSIS — I10 Essential (primary) hypertension: Secondary | ICD-10-CM | POA: Diagnosis not present

## 2021-06-24 DIAGNOSIS — E1169 Type 2 diabetes mellitus with other specified complication: Secondary | ICD-10-CM | POA: Diagnosis not present

## 2021-06-24 DIAGNOSIS — M816 Localized osteoporosis [Lequesne]: Secondary | ICD-10-CM | POA: Diagnosis not present

## 2021-06-24 DIAGNOSIS — R0981 Nasal congestion: Secondary | ICD-10-CM

## 2021-06-24 DIAGNOSIS — E119 Type 2 diabetes mellitus without complications: Secondary | ICD-10-CM

## 2021-06-24 DIAGNOSIS — E785 Hyperlipidemia, unspecified: Secondary | ICD-10-CM

## 2021-06-24 MED ORDER — ROSUVASTATIN CALCIUM 40 MG PO TABS: ORAL_TABLET | ORAL | 1 refills | Status: DC

## 2021-06-24 MED ORDER — AMLODIPINE BESYLATE 5 MG PO TABS: ORAL_TABLET | ORAL | 1 refills | Status: DC

## 2021-06-24 MED ORDER — AZELASTINE HCL 0.1 % NA SOLN: 2.0000 | Freq: Two times a day (BID) | NASAL | 5 refills | Status: DC

## 2021-06-24 MED ORDER — LOSARTAN POTASSIUM 100 MG PO TABS: ORAL_TABLET | ORAL | 1 refills | Status: DC

## 2021-06-24 MED ORDER — METFORMIN HCL 500 MG PO TABS: ORAL_TABLET | ORAL | 1 refills | Status: DC

## 2021-06-24 MED ORDER — GLIPIZIDE 5 MG PO TABS: ORAL_TABLET | ORAL | 1 refills | Status: DC

## 2021-06-24 NOTE — Progress Notes (Addendum)
   Subjective:    Patient ID: Tracy Spencer, female    DOB: Sep 20, 1951, 70 y.o.   MRN: [id number]  Diabetes She presents for her follow-up diabetic visit.  The patient was seen today as part of a comprehensive diabetic check up. Patient has diabetes  Compliance-overall good compliance Low sugars-denies any low sugar spells Dietary effort-trying to work hard on diet not exercising as much as she should we did talk about fitting and walking Foot exam and ophthalmology exam requirements were reviewed   Sinus drainage, and question about bone density results has been going on for weeks.  No mucoid drainage just clear or white no pain no fever  Localized osteoporosis, unspecified pathological fracture presence  Essential hypertension, benign - Plan: Hepatic function panel, Hemoglobin A1c, Basic metabolic panel, CANCELED: Lipid panel  Hyperlipidemia associated with type 2 diabetes mellitus (HCC) - Plan: Hepatic function panel, Hemoglobin A1c, Basic metabolic panel, Lipid panel, CANCELED: Lipid panel  Type 2 diabetes mellitus without complication, without long-term current use of insulin (HCC) - Plan: Hepatic function panel, Hemoglobin A1c, Basic metabolic panel, CANCELED: Lipid panel  Sinus congestion - Plan: azelastine (ASTELIN) 0.1 % nasal spray Long discussion held regarding osteoporosis the risk for fractures and the approach to where to go with this.  In addition to this she did not tolerate Fosamax orally.  Therefore would need IV She is hopeful that this would help things and not cause her side effects we did discuss over common side effects including potential for rash allergy as well as muscle aches or joint pains although all of those seem to be minor in terms of frequency of side effects  Review of Systems     Objective:   Physical Exam  General-in no acute distress Eyes-no discharge Lungs-respiratory rate normal, CTA CV-no murmurs,RRR Extremities skin warm dry no  edema Neuro grossly normal Behavior normal, alert       Assessment & Plan:  1. Localized osteoporosis, unspecified pathological fracture presence Long discussion held regarding this.  Does not tolerate Fosamax.  Recommend reclast.  Side effects discussed.  Patient on board.  2. Essential hypertension, benign Blood pressure decent control healthy diet continue medications - Hepatic function panel - Hemoglobin A1c - Basic metabolic panel  3. Hyperlipidemia associated with type 2 diabetes mellitus (HCC) Continue cholesterol medicine check cholesterol profile - Hepatic function panel - Hemoglobin A1c - Basic metabolic panel - Lipid panel  4. Type 2 diabetes mellitus without complication, without long-term current use of insulin (HCC) Continue diabetes medicine watch diet fit in some walking check A1c - Hepatic function panel - Hemoglobin A1c - Basic metabolic panel  5. Sinus congestion Sinus congestion we will go ahead with Astelin should gradually do better continue Zyrtec - azelastine (ASTELIN) 0.1 % nasal spray; Place 2 sprays into both nostrils 2 (two) times daily. Use in each nostril as directed  Dispense: 30 mL; Refill: 5  Follow-up 6 months Will have nurses work on trying to get her Reclast

## 2021-06-25 ENCOUNTER — Other Ambulatory Visit: Payer: Self-pay | Admitting: Family Medicine

## 2021-06-25 DIAGNOSIS — E119 Type 2 diabetes mellitus without complications: Secondary | ICD-10-CM

## 2021-06-25 DIAGNOSIS — Z79899 Other long term (current) drug therapy: Secondary | ICD-10-CM

## 2021-06-25 DIAGNOSIS — I10 Essential (primary) hypertension: Secondary | ICD-10-CM

## 2021-06-25 LAB — HEPATIC FUNCTION PANEL
ALT: 17 IU/L (ref 0–32)
AST: 20 IU/L (ref 0–40)
Albumin: 4.6 g/dL (ref 3.7–4.7)
Alkaline Phosphatase: 88 IU/L (ref 44–121)
Bilirubin Total: 0.3 mg/dL (ref 0.0–1.2)
Bilirubin, Direct: 0.13 mg/dL (ref 0.00–0.40)
Total Protein: 7.4 g/dL (ref 6.0–8.5)

## 2021-06-25 LAB — LIPID PANEL
Chol/HDL Ratio: 2.6 ratio (ref 0.0–4.4)
Cholesterol, Total: 156 mg/dL (ref 100–199)
HDL: 59 mg/dL (ref >39)
LDL Chol Calc (NIH): 82 mg/dL (ref 0–99)
Triglycerides: 79 mg/dL (ref 0–149)
VLDL Cholesterol Cal: 15 mg/dL (ref 5–40)

## 2021-06-25 LAB — HEMOGLOBIN A1C
Est. average glucose Bld gHb Est-mCnc: 169 mg/dL
Hgb A1c MFr Bld: 7.5 % — ABNORMAL HIGH (ref 4.8–5.6)

## 2021-06-25 LAB — BASIC METABOLIC PANEL
BUN/Creatinine Ratio: 10 — ABNORMAL LOW (ref 12–28)
BUN: 13 mg/dL (ref 8–27)
CO2: 23 mmol/L (ref 20–29)
Calcium: 10.1 mg/dL (ref 8.7–10.3)
Chloride: 104 mmol/L (ref 96–106)
Creatinine, Ser: 1.36 mg/dL — ABNORMAL HIGH (ref 0.57–1.00)
Glucose: 123 mg/dL — ABNORMAL HIGH (ref 65–99)
Potassium: 3.8 mmol/L (ref 3.5–5.2)
Sodium: 141 mmol/L (ref 134–144)
eGFR: 41 mL/min/{1.73_m2} — ABNORMAL LOW (ref >59)

## 2021-07-03 NOTE — Progress Notes (Signed)
Orders are on door for signature

## 2021-07-08 NOTE — Progress Notes (Signed)
07/08/21-Reclast order faxed to [facility] ([facility])

## 2021-08-07 ENCOUNTER — Telehealth: Payer: Self-pay | Admitting: Family Medicine

## 2021-08-07 NOTE — Telephone Encounter (Signed)
Left message for patient to call back and schedule Medicare Annual Wellness Visit (AWV) in office.   If unable to come into the office for AWV,  please offer to do virtually or by telephone.  Last AWV: 12/19/2015  Please schedule at anytime with [facility]Nurse Health Advisor.  40 minute appointment  Any questions, please contact me at [phone]

## 2021-08-13 ENCOUNTER — Encounter (HOSPITAL_COMMUNITY): Payer: Self-pay

## 2021-08-13 ENCOUNTER — Encounter (HOSPITAL_COMMUNITY)
Admission: RE | Admit: 2021-08-13 | Discharge: 2021-08-13 | Disposition: A | Discharge status: Home / Self Care | Payer: Medicare HMO | Source: Ambulatory Visit | Attending: Family Medicine | Admitting: Family Medicine

## 2021-08-13 DIAGNOSIS — M810 Age-related osteoporosis without current pathological fracture: Secondary | ICD-10-CM | POA: Insufficient documentation

## 2021-08-13 MED ORDER — ZOLEDRONIC ACID 5 MG/100ML IV SOLN
INTRAVENOUS | Status: AC
  Filled 2021-08-13: qty 100

## 2021-08-13 MED ORDER — SODIUM CHLORIDE 0.9 % IV SOLN: INTRAVENOUS | Status: DC

## 2021-08-13 MED ORDER — ZOLEDRONIC ACID 5 MG/100ML IV SOLN
5.0000 mg | Freq: Once | INTRAVENOUS | Status: AC
  Administered 2021-08-13: 5 mg via INTRAVENOUS

## 2021-08-26 ENCOUNTER — Other Ambulatory Visit: Payer: Self-pay

## 2021-08-26 MED ORDER — METFORMIN HCL 500 MG PO TABS: ORAL_TABLET | ORAL | 1 refills | Status: DC

## 2021-10-01 LAB — BASIC METABOLIC PANEL
BUN/Creatinine Ratio: 9 — ABNORMAL LOW (ref 12–28)
BUN: 11 mg/dL (ref 8–27)
CO2: 22 mmol/L (ref 20–29)
Calcium: 9.4 mg/dL (ref 8.7–10.3)
Chloride: 103 mmol/L (ref 96–106)
Creatinine, Ser: 1.26 mg/dL — ABNORMAL HIGH (ref 0.57–1.00)
Glucose: 123 mg/dL — ABNORMAL HIGH (ref 70–99)
Potassium: 4.1 mmol/L (ref 3.5–5.2)
Sodium: 138 mmol/L (ref 134–144)
eGFR: 45 mL/min/{1.73_m2} — ABNORMAL LOW (ref >59)

## 2021-10-01 LAB — MICROALBUMIN / CREATININE URINE RATIO
Creatinine, Urine: 94.4 mg/dL
Microalb/Creat Ratio: 198 mg/g creat — ABNORMAL HIGH (ref 0–29)
Microalbumin, Urine: 186.8 ug/mL

## 2021-10-01 LAB — HEMOGLOBIN A1C
Est. average glucose Bld gHb Est-mCnc: 146 mg/dL
Hgb A1c MFr Bld: 6.7 % — ABNORMAL HIGH (ref 4.8–5.6)

## 2021-10-21 ENCOUNTER — Other Ambulatory Visit: Payer: Self-pay | Admitting: *Deleted

## 2021-10-21 MED ORDER — POTASSIUM CHLORIDE CRYS ER 20 MEQ PO TBCR: EXTENDED_RELEASE_TABLET | ORAL | 0 refills | Status: DC

## 2021-10-29 ENCOUNTER — Ambulatory Visit: Payer: Medicare HMO | Admitting: Family Medicine

## 2021-10-31 ENCOUNTER — Other Ambulatory Visit: Payer: Self-pay

## 2021-10-31 ENCOUNTER — Encounter: Payer: Self-pay | Admitting: Family Medicine

## 2021-10-31 ENCOUNTER — Ambulatory Visit: Payer: Medicare HMO | Admitting: Family Medicine

## 2021-10-31 VITALS — BP 138/86 | HR 64 | Temp 97.2°F | Ht 65.0 in | Wt 184.0 lb

## 2021-10-31 DIAGNOSIS — E785 Hyperlipidemia, unspecified: Secondary | ICD-10-CM | POA: Diagnosis not present

## 2021-10-31 DIAGNOSIS — E1169 Type 2 diabetes mellitus with other specified complication: Secondary | ICD-10-CM | POA: Diagnosis not present

## 2021-10-31 DIAGNOSIS — E1122 Type 2 diabetes mellitus with diabetic chronic kidney disease: Secondary | ICD-10-CM | POA: Diagnosis not present

## 2021-10-31 DIAGNOSIS — I10 Essential (primary) hypertension: Secondary | ICD-10-CM | POA: Diagnosis not present

## 2021-10-31 DIAGNOSIS — N1831 Chronic kidney disease, stage 3a: Secondary | ICD-10-CM

## 2021-10-31 NOTE — Progress Notes (Signed)
° °  Subjective:    Patient ID: Tracy Spencer, female    DOB: 1951/01/31, 71 y.o.   MRN: [id number]  HPI Follow up urine protein  Results for orders placed or performed in visit on 06/25/21  Hemoglobin A1c  Result Value Ref Range   Hgb A1c MFr Bld 6.7 (H) 4.8 - 5.6 %   Est. average glucose Bld gHb Est-mCnc 146 mg/dL  Basic Metabolic Panel (BMET)  Result Value Ref Range   Glucose 123 (H) 70 - 99 mg/dL   BUN 11 8 - 27 mg/dL   Creatinine, Ser 1.26 (H) 0.57 - 1.00 mg/dL   eGFR 45 (L) >59 mL/min/1.73   BUN/Creatinine Ratio 9 (L) 12 - 28   Sodium 138 134 - 144 mmol/L   Potassium 4.1 3.5 - 5.2 mmol/L   Chloride 103 96 - 106 mmol/L   CO2 22 20 - 29 mmol/L   Calcium 9.4 8.7 - 10.3 mg/dL  Urine Microalbumin w/creat. ratio  Result Value Ref Range   Creatinine, Urine 94.4 Not Estab. mg/dL   Microalbumin, Urine 186.8 Not Estab. ug/mL   Microalb/Creat Ratio 198 (H) 0 - 29 mg/g creat    We reviewed over her labs Electrolytes overall look good I am concerned about creatinine elevated as well as urine micro protein.  A1c stable.  Patient has history of hypertension takes her medicine regular basis tries watch her diet tries to stay active.  Stress under decent control denies being depressed Diabetes she tries to be careful with starches in the diet and sugars.  She tries to stay active. She does take a potassium. She is on metformin.  Tolerating well.  Review of Systems     Objective:   Physical Exam  General-in no acute distress Eyes-no discharge Lungs-respiratory rate normal, CTA CV-no murmurs,RRR Extremities skin warm dry no edema Neuro grossly normal Behavior normal, alert Bilateral foot exam normal      Assessment & Plan:  1. Hyperlipidemia associated with type 2 diabetes mellitus (HCC) Cholesterol good control.  Healthy diet recommended.  Continue medication.  2. Essential hypertension, benign Blood pressure decent control encourage patient to get in more walking.   Follow-up again in several weeks time.  Looking at adding medication such as Jardiance patient very hesitant to do so we will send the information to her.  3. Type 2 diabetes mellitus with stage 3a chronic kidney disease, without long-term current use of insulin ([place]) Her diabetes under good control but I am very concerned about her kidneys.  Medication such as Jardiance would slow the progression.  Patient very hesitant.  We had long discussion today greater than half the time was spent with that with some understanding from the patient but once again very hesitant to be on medication I will send her additional information she will look over this then she will do a follow-up again in approximately a month to discuss some medication

## 2021-12-03 ENCOUNTER — Ambulatory Visit (INDEPENDENT_AMBULATORY_CARE_PROVIDER_SITE_OTHER): Payer: Medicare HMO | Admitting: Family Medicine

## 2021-12-03 ENCOUNTER — Other Ambulatory Visit: Payer: Self-pay

## 2021-12-03 ENCOUNTER — Encounter: Payer: Self-pay | Admitting: Family Medicine

## 2021-12-03 VITALS — BP 138/82 | HR 65 | Temp 97.0°F | Wt 186.0 lb

## 2021-12-03 DIAGNOSIS — I10 Essential (primary) hypertension: Secondary | ICD-10-CM | POA: Diagnosis not present

## 2021-12-03 DIAGNOSIS — E785 Hyperlipidemia, unspecified: Secondary | ICD-10-CM

## 2021-12-03 DIAGNOSIS — E1122 Type 2 diabetes mellitus with diabetic chronic kidney disease: Secondary | ICD-10-CM | POA: Diagnosis not present

## 2021-12-03 DIAGNOSIS — Z79899 Other long term (current) drug therapy: Secondary | ICD-10-CM | POA: Diagnosis not present

## 2021-12-03 DIAGNOSIS — N1831 Chronic kidney disease, stage 3a: Secondary | ICD-10-CM

## 2021-12-03 DIAGNOSIS — E1169 Type 2 diabetes mellitus with other specified complication: Secondary | ICD-10-CM

## 2021-12-03 NOTE — Progress Notes (Signed)
° °  Subjective:    Patient ID: Tracy Spencer, female    DOB: May 07, 1951, 71 y.o.   MRN: [id number]  HPI Pt following up from 10/31/21 visit. Pt still hesitant regarding Jardiance. Sugars have been up and down. Pt states she eats the same thing; could be the time she eats that is making her sugars elevated.  Had a long discussion with the patient regarding medications chronic kidney disease and how she is doing.  I believe she is trying to work hard at her diet and activity.  We did discuss the importance of keeping creatinine and GFR in a decent range in addition to this also discussed holding off on Jardiance currently  Review of Systems     Objective:   Physical Exam  General-in no acute distress Eyes-no discharge Lungs-respiratory rate normal, CTA CV-no murmurs,RRR Extremities skin warm dry no edema Neuro grossly normal Behavior normal, alert       Assessment & Plan:  Recommend healthy eating regular activity Lab work before next visit Keep GFR and creatinine it is good to stage as possible Has CKD stage IIIa Hold off on Jardiance per patient request currently Continue other medications Follow-up in approximately 4 to 6 months

## 2021-12-23 ENCOUNTER — Ambulatory Visit: Payer: Medicare HMO | Admitting: Family Medicine

## 2021-12-24 ENCOUNTER — Other Ambulatory Visit: Payer: Self-pay | Admitting: *Deleted

## 2021-12-24 MED ORDER — ROSUVASTATIN CALCIUM 40 MG PO TABS: ORAL_TABLET | ORAL | 0 refills | Status: DC

## 2021-12-28 ENCOUNTER — Other Ambulatory Visit: Payer: Self-pay | Admitting: Family Medicine

## 2021-12-31 ENCOUNTER — Other Ambulatory Visit: Payer: Self-pay | Admitting: *Deleted

## 2021-12-31 MED ORDER — GLIPIZIDE 5 MG PO TABS: ORAL_TABLET | ORAL | 0 refills | Status: DC

## 2022-01-21 ENCOUNTER — Other Ambulatory Visit: Payer: Self-pay

## 2022-01-21 MED ORDER — LOSARTAN POTASSIUM 100 MG PO TABS: ORAL_TABLET | ORAL | 1 refills | Status: DC

## 2022-01-29 ENCOUNTER — Other Ambulatory Visit: Payer: Self-pay | Admitting: Family Medicine

## 2022-03-05 ENCOUNTER — Other Ambulatory Visit: Payer: Self-pay | Admitting: Family Medicine

## 2022-03-12 ENCOUNTER — Other Ambulatory Visit: Payer: Self-pay | Admitting: Family Medicine

## 2022-03-17 ENCOUNTER — Telehealth: Payer: Self-pay | Admitting: Family Medicine

## 2022-03-17 DIAGNOSIS — Z79899 Other long term (current) drug therapy: Secondary | ICD-10-CM

## 2022-03-17 DIAGNOSIS — I10 Essential (primary) hypertension: Secondary | ICD-10-CM

## 2022-03-17 DIAGNOSIS — E785 Hyperlipidemia, unspecified: Secondary | ICD-10-CM

## 2022-03-17 DIAGNOSIS — N1831 Chronic kidney disease, stage 3a: Secondary | ICD-10-CM

## 2022-03-17 NOTE — Telephone Encounter (Signed)
Please add B12 level to her current labs ordered in February Reason the high risk medication, taking metformin Also remind the patient to get these labs all of them completed before her visit in July

## 2022-03-18 NOTE — Telephone Encounter (Signed)
Blood Work ordered in Epic. Patient notified. 

## 2022-03-27 NOTE — H&P (Signed)
Surgical History & Physical  Patient Name: Tracy Spencer DOB: 07/11/1951  Surgery: Cataract extraction with intraocular lens implant phacoemulsification; Left Eye  Surgeon: Fabio Pierce MD Surgery Date:  04-07-22 Pre-Op Date:  03-27-22  HPI: A 16 Yr. old female patient 1. 1. The patient complains of nighttime light - car headlights, street lamps etc. glare causing poor vision, which began 6 months ago. The left eye is affected. The episode is gradual. The condition's severity increased since last visit. Symptoms occur when the patient is driving. The complaint is associated with glare. This is negatively affecting the patient's quality of life and the patient is unable to function adequately in life with the current level of vision. is referred by Dr Charise Killian for cataract eval. HPI Completed by Dr. Fabio Pierce  Medical History: Cataracts Diabetes High Blood Pressure LDL  Review of Systems Allergic/Immunologic Seasonal Allergies All recorded systems are negative except as noted above.  Social   Never smoked   Medication  Glipizide, Metformin, Amlodipine, Potassium chloride, Losartan-Hydrochlorothiazide, Crestor,   Sx/Procedures CE/IOL OD; Hoya 250 +19.5,  Hysterectomy, Knee Surgery,   Drug Allergies   NKDA  History & Physical: Heent: Cataract, Left eye NECK: supple without bruits LUNGS: lungs clear to auscultation CV: regular rate and rhythm Abdomen: soft and non-tender Impression & Plan: Assessment: 1.  COMBINED FORMS AGE RELATED CATARACT; Left Eye (H25.812) 2.  BLEPHARITIS; Right Upper Lid, Right Lower Lid, Left Upper Lid, Left Lower Lid (H01.001, H01.002,H01.004,H01.005) 3.  INTRAOCULAR LENS IOL (Z96.1) 4.  PCO; Right Eye (H26.491) 5.  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  Cataract accounts for the patient's decreased vision. This visual impairment is not correctable with a tolerable change in glasses or contact  lenses. Cataract surgery with an implantation of a new lens should significantly improve the visual and functional status of the patient. Discussed all risks, benefits, alternatives, and potential complications. Discussed the procedures and recovery. Patient desires to have surgery. A-scan ordered and performed today for intra-ocular lens calculations. The surgery will be performed in order to improve vision for driving, reading, and for eye examinations. Recommend phacoemulsification with intra-ocular lens. Recommend Dextenza for post-operative pain and inflammation. Left Eye. Surgery required to correct imbalance of vision. Dilates well - shugarcaine by protocol.  2.  Recommend regular lid cleaning.  3.  mild PCO, looks good.  4.  Asymptomatic. Findings, prognosis and treatment options reviewed. No indication for laser at this point, will observe for changes.  5.  Observe; Artificial tears as needed for irritation.  6.  Asymptomatic. Findings, prognosis and treatment options reviewed. OCT macula today.

## 2022-03-28 ENCOUNTER — Encounter (HOSPITAL_COMMUNITY): Payer: Self-pay

## 2022-03-28 ENCOUNTER — Encounter (HOSPITAL_COMMUNITY)
Admission: RE | Admit: 2022-03-28 | Discharge: 2022-03-28 | Disposition: A | Discharge status: Home / Self Care | Payer: Medicare HMO | Source: Ambulatory Visit | Attending: Ophthalmology | Admitting: Ophthalmology

## 2022-04-02 ENCOUNTER — Other Ambulatory Visit (HOSPITAL_COMMUNITY): Payer: Self-pay | Admitting: Family Medicine

## 2022-04-02 ENCOUNTER — Other Ambulatory Visit: Payer: Self-pay | Admitting: Family Medicine

## 2022-04-02 DIAGNOSIS — Z1231 Encounter for screening mammogram for malignant neoplasm of breast: Secondary | ICD-10-CM

## 2022-04-02 LAB — MICROALBUMIN / CREATININE URINE RATIO
Creatinine, Urine: 120.2 mg/dL
Microalb/Creat Ratio: 110 mg/g creat — ABNORMAL HIGH (ref 0–29)
Microalbumin, Urine: 132.8 ug/mL

## 2022-04-02 LAB — LIPID PANEL
Chol/HDL Ratio: 2.2 ratio (ref 0.0–4.4)
Cholesterol, Total: 157 mg/dL (ref 100–199)
HDL: 70 mg/dL (ref >39)
LDL Chol Calc (NIH): 74 mg/dL (ref 0–99)
Triglycerides: 64 mg/dL (ref 0–149)
VLDL Cholesterol Cal: 13 mg/dL (ref 5–40)

## 2022-04-02 LAB — HEPATIC FUNCTION PANEL
ALT: 11 IU/L (ref 0–32)
AST: 20 IU/L (ref 0–40)
Albumin: 5 g/dL — ABNORMAL HIGH (ref 3.7–4.7)
Alkaline Phosphatase: 56 IU/L (ref 44–121)
Bilirubin Total: 0.3 mg/dL (ref 0.0–1.2)
Bilirubin, Direct: 0.12 mg/dL (ref 0.00–0.40)
Total Protein: 7.5 g/dL (ref 6.0–8.5)

## 2022-04-02 LAB — VITAMIN B12: Vitamin B-12: 403 pg/mL (ref 232–1245)

## 2022-04-02 LAB — BASIC METABOLIC PANEL
BUN/Creatinine Ratio: 13 (ref 12–28)
BUN: 15 mg/dL (ref 8–27)
CO2: 23 mmol/L (ref 20–29)
Calcium: 9.8 mg/dL (ref 8.7–10.3)
Chloride: 98 mmol/L (ref 96–106)
Creatinine, Ser: 1.14 mg/dL — ABNORMAL HIGH (ref 0.57–1.00)
Glucose: 117 mg/dL — ABNORMAL HIGH (ref 70–99)
Potassium: 3.9 mmol/L (ref 3.5–5.2)
Sodium: 136 mmol/L (ref 134–144)
eGFR: 51 mL/min/{1.73_m2} — ABNORMAL LOW (ref >59)

## 2022-04-02 LAB — HEMOGLOBIN A1C
Est. average glucose Bld gHb Est-mCnc: 137 mg/dL
Hgb A1c MFr Bld: 6.4 % — ABNORMAL HIGH (ref 4.8–5.6)

## 2022-04-07 ENCOUNTER — Encounter (HOSPITAL_COMMUNITY)
Admission: RE | Disposition: A | Discharge status: Home / Self Care | Payer: Self-pay | Source: Home / Self Care | Attending: Ophthalmology

## 2022-04-07 ENCOUNTER — Ambulatory Visit (HOSPITAL_COMMUNITY)
Admission: RE | Admit: 2022-04-07 | Discharge: 2022-04-07 | Disposition: A | Discharge status: Home / Self Care | Payer: Medicare HMO | Attending: Ophthalmology | Admitting: Ophthalmology

## 2022-04-07 ENCOUNTER — Ambulatory Visit (HOSPITAL_COMMUNITY): Payer: Medicare HMO | Admitting: Certified Registered Nurse Anesthetist

## 2022-04-07 ENCOUNTER — Ambulatory Visit (HOSPITAL_BASED_OUTPATIENT_CLINIC_OR_DEPARTMENT_OTHER): Payer: Medicare HMO | Admitting: Certified Registered Nurse Anesthetist

## 2022-04-07 DIAGNOSIS — Z7984 Long term (current) use of oral hypoglycemic drugs: Secondary | ICD-10-CM

## 2022-04-07 DIAGNOSIS — I10 Essential (primary) hypertension: Secondary | ICD-10-CM | POA: Diagnosis not present

## 2022-04-07 DIAGNOSIS — H25812 Combined forms of age-related cataract, left eye: Secondary | ICD-10-CM

## 2022-04-07 DIAGNOSIS — H01004 Unspecified blepharitis left upper eyelid: Secondary | ICD-10-CM | POA: Insufficient documentation

## 2022-04-07 DIAGNOSIS — H01002 Unspecified blepharitis right lower eyelid: Secondary | ICD-10-CM | POA: Insufficient documentation

## 2022-04-07 DIAGNOSIS — H01001 Unspecified blepharitis right upper eyelid: Secondary | ICD-10-CM | POA: Insufficient documentation

## 2022-04-07 DIAGNOSIS — E1136 Type 2 diabetes mellitus with diabetic cataract: Secondary | ICD-10-CM | POA: Diagnosis not present

## 2022-04-07 DIAGNOSIS — H35373 Puckering of macula, bilateral: Secondary | ICD-10-CM | POA: Insufficient documentation

## 2022-04-07 DIAGNOSIS — H01005 Unspecified blepharitis left lower eyelid: Secondary | ICD-10-CM | POA: Insufficient documentation

## 2022-04-07 DIAGNOSIS — E119 Type 2 diabetes mellitus without complications: Secondary | ICD-10-CM | POA: Diagnosis not present

## 2022-04-07 DIAGNOSIS — H11151 Pinguecula, right eye: Secondary | ICD-10-CM | POA: Diagnosis not present

## 2022-04-07 HISTORY — PX: CATARACT EXTRACTION W/PHACO: SHX586

## 2022-04-07 LAB — GLUCOSE, CAPILLARY: Glucose-Capillary: 121 mg/dL — ABNORMAL HIGH (ref 70–99)

## 2022-04-07 SURGERY — PHACOEMULSIFICATION, CATARACT, WITH IOL INSERTION
Anesthesia: Monitor Anesthesia Care | Site: Eye | Laterality: Left

## 2022-04-07 MED ORDER — PHENYLEPHRINE HCL 2.5 % OP SOLN
1.0000 [drp] | OPHTHALMIC | Status: AC | PRN
  Administered 2022-04-07 (×3): 1 [drp] via OPHTHALMIC

## 2022-04-07 MED ORDER — STERILE WATER FOR IRRIGATION IR SOLN
Status: DC | PRN
  Administered 2022-04-07: 500 mL

## 2022-04-07 MED ORDER — SODIUM HYALURONATE 23MG/ML IO SOSY
PREFILLED_SYRINGE | INTRAOCULAR | Status: DC | PRN
  Administered 2022-04-07: 0.6 mL via INTRAOCULAR

## 2022-04-07 MED ORDER — SODIUM CHLORIDE 0.9% FLUSH
INTRAVENOUS | Status: DC | PRN
  Administered 2022-04-07: 3 mL via INTRAVENOUS

## 2022-04-07 MED ORDER — MIDAZOLAM HCL 2 MG/2ML IJ SOLN
INTRAMUSCULAR | Status: AC
  Filled 2022-04-07: qty 2

## 2022-04-07 MED ORDER — TROPICAMIDE 1 % OP SOLN
1.0000 [drp] | OPHTHALMIC | Status: AC | PRN
  Administered 2022-04-07 (×3): 1 [drp] via OPHTHALMIC

## 2022-04-07 MED ORDER — BSS IO SOLN
INTRAOCULAR | Status: DC | PRN
  Administered 2022-04-07: 15 mL via INTRAOCULAR

## 2022-04-07 MED ORDER — SODIUM HYALURONATE 10 MG/ML IO SOLUTION
PREFILLED_SYRINGE | INTRAOCULAR | Status: DC | PRN
  Administered 2022-04-07: 0.85 mL via INTRAOCULAR

## 2022-04-07 MED ORDER — TETRACAINE HCL 0.5 % OP SOLN
1.0000 [drp] | OPHTHALMIC | Status: AC | PRN
  Administered 2022-04-07 (×3): 1 [drp] via OPHTHALMIC

## 2022-04-07 MED ORDER — LIDOCAINE HCL (PF) 1 % IJ SOLN
INTRAOCULAR | Status: DC | PRN
  Administered 2022-04-07: .9 mL via OPHTHALMIC

## 2022-04-07 MED ORDER — FENTANYL CITRATE (PF) 100 MCG/2ML IJ SOLN
INTRAMUSCULAR | Status: AC
  Filled 2022-04-07: qty 2

## 2022-04-07 MED ORDER — POVIDONE-IODINE 5 % OP SOLN
OPHTHALMIC | Status: DC | PRN
  Administered 2022-04-07: 1 via OPHTHALMIC

## 2022-04-07 MED ORDER — MIDAZOLAM HCL 5 MG/5ML IJ SOLN
INTRAMUSCULAR | Status: DC | PRN
  Administered 2022-04-07: 1 mg via INTRAVENOUS

## 2022-04-07 MED ORDER — LIDOCAINE HCL 3.5 % OP GEL: Freq: Once | OPHTHALMIC | Status: AC

## 2022-04-07 MED ORDER — NEOMYCIN-POLYMYXIN-DEXAMETH 3.5-10000-0.1 OP SUSP
OPHTHALMIC | Status: DC | PRN
  Administered 2022-04-07: 2 [drp] via OPHTHALMIC

## 2022-04-07 MED ORDER — EPINEPHRINE PF 1 MG/ML IJ SOLN
INTRAOCULAR | Status: DC | PRN
  Administered 2022-04-07: 500 mL

## 2022-04-07 MED ORDER — FENTANYL CITRATE (PF) 100 MCG/2ML IJ SOLN
INTRAMUSCULAR | Status: DC | PRN
  Administered 2022-04-07: 50 ug via INTRAVENOUS

## 2022-04-07 SURGICAL SUPPLY — 19 items
CATARACT SUITE SIGHTPATH (MISCELLANEOUS) ×2 IMPLANT
CLOTH BEACON ORANGE TIMEOUT ST (SAFETY) ×2 IMPLANT
EYE SHIELD UNIVERSAL CLEAR (GAUZE/BANDAGES/DRESSINGS) ×1 IMPLANT
FEE CATARACT SUITE SIGHTPATH (MISCELLANEOUS) ×1 IMPLANT
GLOVE BIOGEL PI IND STRL 7.0 (GLOVE) ×2 IMPLANT
GLOVE BIOGEL PI IND STRL 8.5 (GLOVE) IMPLANT
GLOVE BIOGEL PI INDICATOR 7.0 (GLOVE) ×2
GLOVE BIOGEL PI INDICATOR 8.5 (GLOVE) ×1
GOWN STRL REUS W/TWL XL LVL3 (GOWN DISPOSABLE) ×1 IMPLANT
LENS IOL RAYNER 21.0 (Intraocular Lens) ×2 IMPLANT
LENS IOL RAYONE EMV 21.0 (Intraocular Lens) IMPLANT
NDL HYPO 18GX1.5 BLUNT FILL (NEEDLE) IMPLANT
NEEDLE HYPO 18GX1.5 BLUNT FILL (NEEDLE) ×2 IMPLANT
PAD ARMBOARD 7.5X6 YLW CONV (MISCELLANEOUS) ×2 IMPLANT
RING MALYGIN 7.0 (MISCELLANEOUS) IMPLANT
SYR TB 1ML LL NO SAFETY (SYRINGE) ×2 IMPLANT
TAPE SURG TRANSPORE 1 IN (GAUZE/BANDAGES/DRESSINGS) IMPLANT
TAPE SURGICAL TRANSPORE 1 IN (GAUZE/BANDAGES/DRESSINGS) ×2
WATER STERILE IRR 250ML POUR (IV SOLUTION) ×2 IMPLANT

## 2022-04-07 NOTE — Transfer of Care (Signed)
Immediate Anesthesia Transfer of Care Note  Patient: Tracy Spencer  Procedure(s) Performed: CATARACT EXTRACTION PHACO AND INTRAOCULAR LENS PLACEMENT (IOC) (Left: Eye)  Patient Location: Short Stay  Anesthesia Type:MAC  Level of Consciousness: awake, alert  and oriented  Airway & Oxygen Therapy: Patient Spontanous Breathing  Post-op Assessment: Report given to RN and Post -op Vital signs reviewed and stable  Post vital signs: Reviewed and stable  Last Vitals:  Vitals Value Taken Time  BP    Temp    Pulse    Resp    SpO2      Last Pain:  Vitals:   04/07/22 1030  PainSc: 0-No pain         Complications: No notable events documented.

## 2022-04-07 NOTE — Interval H&P Note (Signed)
History and Physical Interval Note:  04/07/2022 11:08 AM  Tracy Spencer  has presented today for surgery, with the diagnosis of Combined forms age related cataract; left.  The various methods of treatment have been discussed with the patient and family. After consideration of risks, benefits and other options for treatment, the patient has consented to  Procedure(s) with comments: CATARACT EXTRACTION PHACO AND INTRAOCULAR LENS PLACEMENT (IOC) (Left) - left as a surgical intervention.  The patient's history has been reviewed, patient examined, no change in status, stable for surgery.  I have reviewed the patient's chart and labs.  Questions were answered to the patient's satisfaction.     Fabio Pierce

## 2022-04-10 ENCOUNTER — Encounter (HOSPITAL_COMMUNITY): Payer: Self-pay | Admitting: Ophthalmology

## 2022-04-18 ENCOUNTER — Other Ambulatory Visit: Payer: Self-pay | Admitting: *Deleted

## 2022-04-18 ENCOUNTER — Other Ambulatory Visit: Payer: Self-pay | Admitting: Family Medicine

## 2022-04-18 MED ORDER — METFORMIN HCL 500 MG PO TABS: ORAL_TABLET | ORAL | 0 refills | Status: DC

## 2022-05-01 ENCOUNTER — Other Ambulatory Visit: Payer: Self-pay

## 2022-05-01 MED ORDER — ROSUVASTATIN CALCIUM 40 MG PO TABS
ORAL_TABLET | ORAL | 0 refills | Status: DC
Start: 2022-05-01 — End: 2022-06-10

## 2022-05-05 ENCOUNTER — Ambulatory Visit (HOSPITAL_COMMUNITY)
Admission: RE | Admit: 2022-05-05 | Discharge: 2022-05-05 | Disposition: A | Discharge status: Home / Self Care | Payer: Medicare HMO | Source: Ambulatory Visit | Attending: Family Medicine | Admitting: Family Medicine

## 2022-05-05 DIAGNOSIS — Z1231 Encounter for screening mammogram for malignant neoplasm of breast: Secondary | ICD-10-CM | POA: Diagnosis present

## 2022-05-06 ENCOUNTER — Ambulatory Visit: Payer: Medicare HMO | Admitting: Family Medicine

## 2022-06-02 ENCOUNTER — Other Ambulatory Visit: Payer: Self-pay | Admitting: Family Medicine

## 2022-06-10 ENCOUNTER — Encounter: Payer: Self-pay | Admitting: Family Medicine

## 2022-06-10 ENCOUNTER — Ambulatory Visit (INDEPENDENT_AMBULATORY_CARE_PROVIDER_SITE_OTHER): Payer: Medicare HMO | Admitting: Family Medicine

## 2022-06-10 VITALS — BP 130/74 | Wt 189.6 lb

## 2022-06-10 DIAGNOSIS — E1169 Type 2 diabetes mellitus with other specified complication: Secondary | ICD-10-CM | POA: Diagnosis not present

## 2022-06-10 DIAGNOSIS — I10 Essential (primary) hypertension: Secondary | ICD-10-CM

## 2022-06-10 DIAGNOSIS — G4486 Cervicogenic headache: Secondary | ICD-10-CM | POA: Diagnosis not present

## 2022-06-10 DIAGNOSIS — E785 Hyperlipidemia, unspecified: Secondary | ICD-10-CM

## 2022-06-10 DIAGNOSIS — E1122 Type 2 diabetes mellitus with diabetic chronic kidney disease: Secondary | ICD-10-CM | POA: Diagnosis not present

## 2022-06-10 DIAGNOSIS — M816 Localized osteoporosis [Lequesne]: Secondary | ICD-10-CM

## 2022-06-10 DIAGNOSIS — N1831 Chronic kidney disease, stage 3a: Secondary | ICD-10-CM

## 2022-06-10 MED ORDER — LOSARTAN POTASSIUM 100 MG PO TABS: ORAL_TABLET | ORAL | 1 refills | Status: DC

## 2022-06-10 MED ORDER — GLIPIZIDE 5 MG PO TABS
ORAL_TABLET | ORAL | 1 refills | Status: DC
Start: 2022-06-10 — End: 2023-01-19

## 2022-06-10 MED ORDER — ROSUVASTATIN CALCIUM 40 MG PO TABS: ORAL_TABLET | ORAL | 1 refills | Status: DC

## 2022-06-10 MED ORDER — METFORMIN HCL 500 MG PO TABS: ORAL_TABLET | ORAL | 1 refills | Status: DC

## 2022-06-10 MED ORDER — AMLODIPINE BESYLATE 5 MG PO TABS: 5.0000 mg | ORAL_TABLET | Freq: Every day | ORAL | 1 refills | Status: DC

## 2022-06-10 MED ORDER — POTASSIUM CHLORIDE CRYS ER 20 MEQ PO TBCR: 20.0000 meq | EXTENDED_RELEASE_TABLET | Freq: Every day | ORAL | 1 refills | Status: DC

## 2022-06-10 NOTE — Progress Notes (Addendum)
   Subjective:    Patient ID: Tracy Spencer, female    DOB: [birth year], 71 y.o.   MRN: [id number]  Diabetes She presents for her follow-up diabetic visit. She has type 2 diabetes mellitus. There are no hypoglycemic associated symptoms. There are no diabetic associated symptoms. There are no hypoglycemic complications. There are no diabetic complications. Risk factors for coronary artery disease include hypertension. She is compliant with treatment all of the time. Home blood sugar record trend: checking sugars daily. She sees a podiatrist.Eye exam is current.  Hypertension This is a chronic problem. Risk factors for coronary artery disease include diabetes mellitus. There are no compliance problems.    Pt states her head feels like a "block". Pt reports pain on lower right side of head close to ear. Pt states it sometimes happens and wakes her up. Takes tylenol and it relieves it but once Tylenol wears off, the pain comes back.    Woke her up twice with headache right posterior  Review of Systems     Objective:   Physical Exam General-in no acute distress Eyes-no discharge Lungs-respiratory rate normal, CTA CV-no murmurs,RRR Extremities skin warm dry no edema Neuro grossly normal Behavior normal, alert  Her lab work from June was reviewed in detail including A1c small amount of protein with urine micro protein as well as creatinine slightly elevated GFR mildly reduced      Assessment & Plan:  1. Type 2 diabetes mellitus with stage 3a chronic kidney disease, without long-term current use of insulin (HCC) Diabetes decent control healthy diet recommended check lab work - Hemoglobin A1c - Basic metabolic panel  2. Essential hypertension, benign Blood pressure good control continue current measures follow-up within 4 to 6 months - Hemoglobin A1c - Basic metabolic panel  3. Hyperlipidemia associated with type 2 diabetes mellitus (HCC) Continue statin watch diet closely stay  active  4. Cervicogenic headache Given these headaches it does not cause double vision nausea or vomiting we will hold off on doing any type of MRI at the moment but it is concerning so therefore have the patient follow-up within 6 weeks to recheck  Has osteoporosis is on IV Levander Campion last once yearly.  Was on oral biphosphonate's but did not tolerate.

## 2022-06-10 NOTE — Patient Instructions (Addendum)
Please go ahead with taking over-the-counter B12 1000 mcg daily Continue your current medications as is Please do your blood work in October with follow-up visit at that time If you start having reoccurrence of the headaches please let me know In that situation we would want to do a scan I will touch base with one of the neurology specialist that I know regarding her headaches and we will let you know what they state    Shingrix and shingles prevention: know the facts!   Shingrix is a very effective vaccine to prevent shingles.   Shingles is a reactivation of chickenpox -more than 99% of Americans born before 1980 have had chickenpox even if they do not remember it. One in every 10 people who get shingles have severe long-lasting nerve pain as a result.   33 out of a 100 older adults will get shingles if they are unvaccinated.     This vaccine is very important for your health This vaccine is indicated for anyone 50 years or older. You can get this vaccine even if you have already had shingles because you can get the disease more than once in a lifetime.  Your risk for shingles and its complications increases with age.  This vaccine has 2 doses.  The second dose would be 2 to 6 months after the first dose.  If you had Zostavax vaccine in the past you should still get Shingrix. ( Zostavax is only 70% effective and it loses significant strength over a few years .)  This vaccine is given through the pharmacy.  The cost of the vaccine is through your insurance. The pharmacy can inform you of the total costs.  Common side effects including soreness in the arm, some redness and swelling, also some feel fatigue muscle soreness headache low-grade fever.  Side effects typically go away within 2 to 3 days. Remember-the pain from shingles can last a lifetime but these side effects of the vaccine will only last a few days at most. It is very important to get both doses in order to protect  yourself fully.   Please get this vaccine at your earliest convenience at your trusted pharmacy.

## 2022-06-19 ENCOUNTER — Telehealth: Payer: Self-pay | Admitting: *Deleted

## 2022-06-19 NOTE — Telephone Encounter (Signed)
[facility] calling for updated orders for Reclast- please advise

## 2022-06-22 NOTE — Telephone Encounter (Signed)
I reviewed over the patient's record Nurses-patient still has osteoporosis.  She does need bone density test.  Please order. Also go ahead and get Reclast approved again.

## 2022-06-26 NOTE — Telephone Encounter (Signed)
Order in office 

## 2022-06-29 ENCOUNTER — Other Ambulatory Visit: Payer: Self-pay | Admitting: Family Medicine

## 2022-07-17 ENCOUNTER — Other Ambulatory Visit: Payer: Self-pay | Admitting: Family Medicine

## 2022-07-18 LAB — BASIC METABOLIC PANEL
BUN/Creatinine Ratio: 10 — ABNORMAL LOW (ref 12–28)
BUN: 11 mg/dL (ref 8–27)
CO2: 23 mmol/L (ref 20–29)
Calcium: 9.9 mg/dL (ref 8.7–10.3)
Chloride: 100 mmol/L (ref 96–106)
Creatinine, Ser: 1.11 mg/dL — ABNORMAL HIGH (ref 0.57–1.00)
Glucose: 197 mg/dL — ABNORMAL HIGH (ref 70–99)
Potassium: 3.8 mmol/L (ref 3.5–5.2)
Sodium: 140 mmol/L (ref 134–144)
eGFR: 52 mL/min/{1.73_m2} — ABNORMAL LOW (ref >59)

## 2022-07-18 LAB — HEMOGLOBIN A1C
Est. average glucose Bld gHb Est-mCnc: 151 mg/dL
Hgb A1c MFr Bld: 6.9 % — ABNORMAL HIGH (ref 4.8–5.6)

## 2022-07-22 NOTE — Telephone Encounter (Signed)
Order has been faxed to [facility] and is scanned under Media in [facility]

## 2022-07-24 ENCOUNTER — Other Ambulatory Visit: Payer: Self-pay

## 2022-07-24 MED ORDER — ROSUVASTATIN CALCIUM 40 MG PO TABS: ORAL_TABLET | ORAL | 0 refills | Status: DC

## 2022-07-29 ENCOUNTER — Ambulatory Visit (INDEPENDENT_AMBULATORY_CARE_PROVIDER_SITE_OTHER): Payer: Medicare HMO | Admitting: Family Medicine

## 2022-07-29 VITALS — BP 146/82 | HR 62 | Temp 96.8°F | Ht 64.0 in | Wt 190.0 lb

## 2022-07-29 DIAGNOSIS — E1169 Type 2 diabetes mellitus with other specified complication: Secondary | ICD-10-CM | POA: Diagnosis not present

## 2022-07-29 DIAGNOSIS — E785 Hyperlipidemia, unspecified: Secondary | ICD-10-CM

## 2022-07-29 DIAGNOSIS — E1122 Type 2 diabetes mellitus with diabetic chronic kidney disease: Secondary | ICD-10-CM | POA: Diagnosis not present

## 2022-07-29 DIAGNOSIS — I10 Essential (primary) hypertension: Secondary | ICD-10-CM

## 2022-07-29 DIAGNOSIS — N1831 Chronic kidney disease, stage 3a: Secondary | ICD-10-CM

## 2022-07-29 DIAGNOSIS — Z23 Encounter for immunization: Secondary | ICD-10-CM

## 2022-07-29 NOTE — Patient Instructions (Signed)

## 2022-07-29 NOTE — Progress Notes (Signed)
   Subjective:    Patient ID: Tracy Spencer, female    DOB: 15-Jun-1951, 71 y.o.   MRN: [id number]  HPI Follow up HTN and diabetes Lab results follow up Type 2 diabetes mellitus with stage 3a chronic kidney disease, without long-term current use of insulin (HCC)  Hyperlipidemia associated with type 2 diabetes mellitus ([place])  Essential hypertension, benign  Immunization due - Plan: Flu Vaccine QUAD High Dose(Fluad)   Review of Systems     Objective:   Physical Exam General-in no acute distress Eyes-no discharge Lungs-respiratory rate normal, CTA CV-no murmurs,RRR Extremities skin warm dry no edema Neuro grossly normal Behavior normal, alert        Assessment & Plan:  1. Type 2 diabetes mellitus with stage 3a chronic kidney disease, without long-term current use of insulin ([place]) Fair control we did discuss paresthesia patient is not interested currently Very important for her to improve dietary measures increase exercise and walking follow-up again in 6 weeks time  2. Hyperlipidemia associated with type 2 diabetes mellitus ([place]) Continue cholesterol medicine healthy diet regular activity  3. Essential hypertension, benign Blood pressure minimally elevated healthy diet regular physical activity walking work hard on this follow-up 6 weeks  4. Immunization due Today - Flu Vaccine QUAD High Dose(Fluad)

## 2022-08-04 ENCOUNTER — Other Ambulatory Visit: Payer: Self-pay

## 2022-08-13 ENCOUNTER — Encounter (HOSPITAL_COMMUNITY): Payer: Medicare HMO

## 2022-08-18 ENCOUNTER — Other Ambulatory Visit: Payer: Self-pay

## 2022-08-18 DIAGNOSIS — M810 Age-related osteoporosis without current pathological fracture: Secondary | ICD-10-CM

## 2022-08-21 ENCOUNTER — Encounter (HOSPITAL_COMMUNITY)
Admission: RE | Admit: 2022-08-21 | Discharge: 2022-08-21 | Disposition: A | Discharge status: Home / Self Care | Payer: Medicare HMO | Source: Ambulatory Visit | Attending: Family Medicine | Admitting: Family Medicine

## 2022-08-21 VITALS — BP 133/65 | HR 60 | Temp 97.7°F | Resp 18

## 2022-08-21 DIAGNOSIS — M810 Age-related osteoporosis without current pathological fracture: Secondary | ICD-10-CM | POA: Insufficient documentation

## 2022-08-21 MED ORDER — SODIUM CHLORIDE 0.9 % IV SOLN: INTRAVENOUS | Status: DC

## 2022-08-21 MED ORDER — DIPHENHYDRAMINE HCL 25 MG PO CAPS: 25.0000 mg | ORAL_CAPSULE | Freq: Once | ORAL | Status: DC

## 2022-08-21 MED ORDER — ZOLEDRONIC ACID 5 MG/100ML IV SOLN
5.0000 mg | Freq: Once | INTRAVENOUS | Status: AC
  Administered 2022-08-21: 5 mg via INTRAVENOUS
  Filled 2022-08-21: qty 100

## 2022-08-21 MED ORDER — ACETAMINOPHEN 325 MG PO TABS: 650.0000 mg | ORAL_TABLET | Freq: Once | ORAL | Status: DC

## 2022-08-21 NOTE — Progress Notes (Signed)
Diagnosis: Osteoporosis  Provider:  Lilyan Punt MD  Procedure: Infusion  IV Type: Peripheral, IV Location: L Antecubital  Reclast (Zolendronic Acid), Dose: 5 mg  Infusion Start Time: 1425  Infusion Stop Time: 1455  Post Infusion IV Care: Patient declined observation and Peripheral IV Discontinued  Discharge: Condition: Good, Destination: Home . AVS provided to patient.   Performed by:  Marin Shutter, RN

## 2022-09-09 ENCOUNTER — Ambulatory Visit (INDEPENDENT_AMBULATORY_CARE_PROVIDER_SITE_OTHER): Payer: Medicare HMO | Admitting: Family Medicine

## 2022-09-09 VITALS — BP 132/72 | HR 65 | Temp 97.3°F | Ht 64.0 in | Wt 189.0 lb

## 2022-09-09 DIAGNOSIS — I10 Essential (primary) hypertension: Secondary | ICD-10-CM | POA: Diagnosis not present

## 2022-09-09 DIAGNOSIS — E785 Hyperlipidemia, unspecified: Secondary | ICD-10-CM

## 2022-09-09 DIAGNOSIS — N1831 Chronic kidney disease, stage 3a: Secondary | ICD-10-CM

## 2022-09-09 DIAGNOSIS — M810 Age-related osteoporosis without current pathological fracture: Secondary | ICD-10-CM | POA: Diagnosis not present

## 2022-09-09 DIAGNOSIS — E1169 Type 2 diabetes mellitus with other specified complication: Secondary | ICD-10-CM | POA: Diagnosis not present

## 2022-09-09 DIAGNOSIS — E1122 Type 2 diabetes mellitus with diabetic chronic kidney disease: Secondary | ICD-10-CM

## 2022-09-09 MED ORDER — ROSUVASTATIN CALCIUM 40 MG PO TABS: ORAL_TABLET | ORAL | 1 refills | Status: DC

## 2022-09-09 NOTE — Progress Notes (Signed)
   Subjective:    Patient ID: Tracy Spencer, female    DOB: Nov 08, 1950, 71 y.o.   MRN: [id number]  Diabetes She presents for her follow-up diabetic visit. She has type 2 diabetes mellitus. Current diabetic treatments: glipizide, netformin.  Hypertension This is a chronic problem. Treatments tried: amlodipine, losartan.   She has not done her blood work She states she will get it done in the near future She denies any low sugar spells States compliance with her medicines  FBS from home 135,123, 119, 120 5.   Review of Systems     Objective:   Physical Exam General-in no acute distress Eyes-no discharge Lungs-respiratory rate normal, CTA CV-no murmurs,RRR Extremities skin warm dry no edema Neuro grossly normal Behavior normal, alert        Assessment & Plan:  1. Essential hypertension, benign HTN- patient seen for follow-up regarding HTN.   Diet, medication compliance, appropriate labs and refills were completed.   Importance of keeping blood pressure under good control to lessen the risk of complications discussed Regular follow-up visits discussed  - Microalbumin/Creatinine Ratio, Urine - Basic Metabolic Panel - Hemoglobin A1c - Lipid Panel - Hepatic Function Panel  2. Type 2 diabetes mellitus with stage 3a chronic kidney disease, without long-term current use of insulin (HCC) The patient was seen today as part of a comprehensive visit for diabetes. The importance of keeping her A1c at or below 7 range was discussed.  Discussed diet, activity, and medication compliance Emphasized healthy eating primarily with vegetables fruits and if utilizing meats lean meats such as chicken or fish grilled baked broiled Avoid sugary drinks Minimize and avoid processed foods Fit in regular physical activity preferably 25 to 30 minutes 4 times per week Standard follow-up visit recommended.  Patient aware lack of control and follow-up increases risk of diabetic  complications. Regular follow-up visits Yearly ophthalmology Yearly foot exam  - Microalbumin/Creatinine Ratio, Urine - Basic Metabolic Panel - Hemoglobin A1c - Lipid Panel - Hepatic Function Panel  3. Hyperlipidemia associated with type 2 diabetes mellitus (HCC) Hyperlipidemia-importance of diet, weight control, activity, compliance with medications discussed.   Recent labs reviewed.   Any additional labs or refills ordered.   Importance of keeping under good control discussed. Regular follow-up visits discussed  - Microalbumin/Creatinine Ratio, Urine - Basic Metabolic Panel - Hemoglobin A1c - Lipid Panel - Hepatic Function Panel  4. Senile osteoporosis She will need to have a bone density completed she recently had Reclast she had body aches with it for 2 to 3 days-this can be a normal side effect but it was discouraging to the patient to experience this - DG Bone Density  Follow-up by May

## 2022-09-30 LAB — HM DIABETES EYE EXAM

## 2022-10-29 ENCOUNTER — Encounter: Payer: Self-pay | Admitting: *Deleted

## 2022-12-10 ENCOUNTER — Other Ambulatory Visit: Payer: Self-pay | Admitting: Family Medicine

## 2022-12-11 ENCOUNTER — Telehealth: Payer: Self-pay

## 2022-12-11 NOTE — Telephone Encounter (Signed)
Pt is wanting to know if any of the medication will cause tingle in her thighs and sometimes she wakes up and her arm or hand is asleep?   Berlene call back [phone]

## 2022-12-11 NOTE — Telephone Encounter (Signed)
It is unlikely that this is due to medications.  Although cholesterol medicine sometimes can cause tingling and the metformin can sometimes cause low B12.  Compression of sensory nerves can cause tingling in the thigh and carpal tunnel can cause tingling in the hand.  More than likely this will need an office visit to help figure out.(Nonemergent) patient can schedule at her convenience

## 2022-12-11 NOTE — Telephone Encounter (Signed)
Discussed with patient. Patient scheduled follow up office visit to discuss 12/29/22 at 3:30pm

## 2022-12-28 ENCOUNTER — Other Ambulatory Visit: Payer: Self-pay | Admitting: Family Medicine

## 2022-12-29 ENCOUNTER — Ambulatory Visit: Payer: Medicare HMO | Admitting: Family Medicine

## 2023-01-13 ENCOUNTER — Ambulatory Visit: Payer: Medicare HMO | Admitting: Family Medicine

## 2023-01-16 ENCOUNTER — Other Ambulatory Visit: Payer: Self-pay | Admitting: Family Medicine

## 2023-01-27 ENCOUNTER — Ambulatory Visit: Payer: Medicare HMO | Admitting: Family Medicine

## 2023-02-09 ENCOUNTER — Telehealth: Payer: Self-pay | Admitting: Family Medicine

## 2023-02-09 MED ORDER — POTASSIUM CHLORIDE CRYS ER 20 MEQ PO TBCR: 20.0000 meq | EXTENDED_RELEASE_TABLET | Freq: Every day | ORAL | 0 refills | Status: DC

## 2023-02-09 NOTE — Telephone Encounter (Signed)
Received via fax Rx request: Prescription sent electronically to pharmacy  

## 2023-02-09 NOTE — Telephone Encounter (Signed)
Refill on    potassium chloride SA (KLOR-CON M) 20 MEQ tablet  [facility]

## 2023-02-12 ENCOUNTER — Ambulatory Visit (INDEPENDENT_AMBULATORY_CARE_PROVIDER_SITE_OTHER): Payer: Medicare HMO | Admitting: Family Medicine

## 2023-02-12 VITALS — BP 138/76 | HR 64 | Ht 64.0 in | Wt 181.0 lb

## 2023-02-12 DIAGNOSIS — F439 Reaction to severe stress, unspecified: Secondary | ICD-10-CM

## 2023-02-12 DIAGNOSIS — M5432 Sciatica, left side: Secondary | ICD-10-CM | POA: Diagnosis not present

## 2023-02-12 DIAGNOSIS — M5431 Sciatica, right side: Secondary | ICD-10-CM | POA: Diagnosis not present

## 2023-02-12 NOTE — Progress Notes (Signed)
   Subjective:    Patient ID: Tracy Spencer, female    DOB: 1951-03-08, 72 y.o.   MRN: [id number]  HPI Patient arrives today with issues with legs burning and tingling.  Burning pain discomfort bilateral.  Lateral aspect into the legs.  Intermittent burning pain other days not so bad this is causing her significant troubles though at times no weakness.  Review of Systems     Objective:   Physical Exam  General-in no acute distress Eyes-no discharge Lungs-respiratory rate normal, CTA CV-no murmurs,RRR Extremities skin warm dry no edema Neuro grossly normal Behavior normal, alert Reflexes good negative straight leg raise      Assessment & Plan:  1. Bilateral sciatica Stretches were shown and given patient defers on any x-rays physical therapy or a physiatrist evaluation currently Patient will think about whether or not she wants to try Cymbalta Lyrica or gabapentin  2. Stress She is dealing with a lot of stress her husband has been diagnosed with advanced lung cancer they are going through tests at [facility] to determine the best treatment  She has a follow-up visit later in the month for her other health issues and will do lab work before that visit

## 2023-02-13 LAB — LIPID PANEL
Chol/HDL Ratio: 2.6 ratio (ref 0.0–4.4)
Cholesterol, Total: 160 mg/dL (ref 100–199)
HDL: 61 mg/dL (ref >39)
LDL Chol Calc (NIH): 86 mg/dL (ref 0–99)
Triglycerides: 63 mg/dL (ref 0–149)
VLDL Cholesterol Cal: 13 mg/dL (ref 5–40)

## 2023-02-13 LAB — BASIC METABOLIC PANEL
BUN/Creatinine Ratio: 9 — ABNORMAL LOW (ref 12–28)
BUN: 12 mg/dL (ref 8–27)
CO2: 20 mmol/L (ref 20–29)
Calcium: 10.2 mg/dL (ref 8.7–10.3)
Chloride: 106 mmol/L (ref 96–106)
Creatinine, Ser: 1.28 mg/dL — ABNORMAL HIGH (ref 0.57–1.00)
Glucose: 52 mg/dL — ABNORMAL LOW (ref 70–99)
Potassium: 4.7 mmol/L (ref 3.5–5.2)
Sodium: 146 mmol/L — ABNORMAL HIGH (ref 134–144)
eGFR: 44 mL/min/{1.73_m2} — ABNORMAL LOW (ref >59)

## 2023-02-13 LAB — HEPATIC FUNCTION PANEL
ALT: 13 IU/L (ref 0–32)
AST: 19 IU/L (ref 0–40)
Albumin: 4.9 g/dL — ABNORMAL HIGH (ref 3.8–4.8)
Alkaline Phosphatase: 47 IU/L (ref 44–121)
Bilirubin Total: 0.4 mg/dL (ref 0.0–1.2)
Bilirubin, Direct: 0.11 mg/dL (ref 0.00–0.40)
Total Protein: 7.5 g/dL (ref 6.0–8.5)

## 2023-02-13 LAB — MICROALBUMIN / CREATININE URINE RATIO
Creatinine, Urine: 144 mg/dL
Microalb/Creat Ratio: 192 mg/g creat — ABNORMAL HIGH (ref 0–29)
Microalbumin, Urine: 276.1 ug/mL

## 2023-02-13 LAB — HEMOGLOBIN A1C
Est. average glucose Bld gHb Est-mCnc: 134 mg/dL
Hgb A1c MFr Bld: 6.3 % — ABNORMAL HIGH (ref 4.8–5.6)

## 2023-03-04 ENCOUNTER — Ambulatory Visit (INDEPENDENT_AMBULATORY_CARE_PROVIDER_SITE_OTHER): Payer: Medicare HMO | Admitting: Family Medicine

## 2023-03-04 VITALS — BP 122/64 | HR 67 | Temp 97.2°F | Ht 64.0 in | Wt 179.6 lb

## 2023-03-04 DIAGNOSIS — Z7984 Long term (current) use of oral hypoglycemic drugs: Secondary | ICD-10-CM

## 2023-03-04 DIAGNOSIS — I10 Essential (primary) hypertension: Secondary | ICD-10-CM

## 2023-03-04 DIAGNOSIS — E1169 Type 2 diabetes mellitus with other specified complication: Secondary | ICD-10-CM | POA: Diagnosis not present

## 2023-03-04 DIAGNOSIS — N183 Chronic kidney disease, stage 3 unspecified: Secondary | ICD-10-CM | POA: Diagnosis not present

## 2023-03-04 DIAGNOSIS — E1122 Type 2 diabetes mellitus with diabetic chronic kidney disease: Secondary | ICD-10-CM | POA: Diagnosis not present

## 2023-03-04 DIAGNOSIS — E785 Hyperlipidemia, unspecified: Secondary | ICD-10-CM

## 2023-03-04 DIAGNOSIS — N1831 Chronic kidney disease, stage 3a: Secondary | ICD-10-CM

## 2023-03-04 MED ORDER — METFORMIN HCL 500 MG PO TABS: ORAL_TABLET | ORAL | 1 refills | Status: DC

## 2023-03-04 MED ORDER — EMPAGLIFLOZIN 10 MG PO TABS: 10.0000 mg | ORAL_TABLET | Freq: Every day | ORAL | 3 refills | Status: DC

## 2023-03-04 MED ORDER — AMLODIPINE BESYLATE 2.5 MG PO TABS: 2.5000 mg | ORAL_TABLET | Freq: Every day | ORAL | 3 refills | Status: DC

## 2023-03-04 MED ORDER — GLIPIZIDE 5 MG PO TABS
2.5000 mg | ORAL_TABLET | Freq: Every day | ORAL | 1 refills | Status: DC
Start: 2023-03-04 — End: 2023-09-16

## 2023-03-04 NOTE — Progress Notes (Unsigned)
Subjective:    Patient ID: Tracy Spencer, female    DOB: [birth year], 72 y.o.   MRN: [id number]  HPI 6 month follow up Pt needs diabetic foot exam.  Patient's liver enzymes normal, albumin slightly elevated Microalbumin/creatinine ratio 192-this is been persistently elevated over the past year despite ARB and good diabetic control Creatinine 1.28 with GFR 44 A1c is 6.3 Cholesterol profile pretty good but LDL could be better goal is to get below 70 CKD stage 3 due to type 2 diabetes mellitus (HCC) - Plan: Basic Metabolic Panel (7), glipiZIDE (GLUCOTROL) 5 MG tablet  Hyperlipidemia associated with type 2 diabetes mellitus (HCC)  Essential hypertension, benign  Type 2 diabetes mellitus with stage 3a chronic kidney disease, without long-term current use of insulin (HCC)  Outpatient Encounter Medications as of 03/04/2023  Medication Sig   amLODipine (NORVASC) 2.5 MG tablet Take 1 tablet (2.5 mg total) by mouth daily.   blood glucose meter kit and supplies Dispense based on patient and insurance preference. Use to check sugars once a day. DX:E11.9   cetirizine (ZYRTEC) 10 MG tablet Take 1 tablet (10 mg total) by mouth daily.   cholecalciferol (VITAMIN D3) 25 MCG (1000 UNIT) tablet Take 1,000 Units by mouth daily.   empagliflozin (JARDIANCE) 10 MG TABS tablet Take 1 tablet (10 mg total) by mouth daily before breakfast.   Lancets (ONETOUCH DELICA PLUS LANCET33G) MISC TEST BLOOD GLUCOSE EVERY DAY   losartan (COZAAR) 100 MG tablet TAKE 1 TABLET BY MOUTH EVERY DAY   Omega-3 Fatty Acids (FISH OIL) 300 MG CAPS Take by mouth.   ONETOUCH ULTRA test strip USE TO TEST BLOOD SUGAR DAILY AS DIRECTED   potassium chloride SA (KLOR-CON M) 20 MEQ tablet Take 1 tablet (20 mEq total) by mouth daily.   rosuvastatin (CRESTOR) 40 MG tablet TAKE 1 TABLET(40 MG) BY MOUTH DAILY   [DISCONTINUED] amLODipine (NORVASC) 5 MG tablet TAKE 1 TABLET(5 MG) BY MOUTH DAILY   [DISCONTINUED] glipiZIDE (GLUCOTROL) 5 MG  tablet TAKE 1/2 TABLET IN THE MORNING AND 1/2 AT SUPPER (Patient taking differently: Take 2.5 mg by mouth daily before breakfast. TAKE 1/2 TABLET IN THE MORNING.)   [DISCONTINUED] metFORMIN (GLUCOPHAGE) 500 MG tablet TAKE 1 TABLET BY MOUTH TWICE DAILY   glipiZIDE (GLUCOTROL) 5 MG tablet Take 0.5 tablets (2.5 mg total) by mouth daily before breakfast. TAKE 1/2 TABLET IN THE MORNING.   metFORMIN (GLUCOPHAGE) 500 MG tablet TAKE 1 TABLET BY MOUTH TWICE DAILY   No facility-administered encounter medications on file as of 03/04/2023.    Review of Systems     Objective:   Physical Exam General-in no acute distress Eyes-no discharge Lungs-respiratory rate normal, CTA CV-no murmurs,RRR Extremities skin warm dry no edema Neuro grossly normal Behavior normal, alert  Diabetic foot exam normal      Assessment & Plan:  1. CKD stage 3 due to type 2 diabetes mellitus (HCC) I am concerned about the situation We did discuss various options including nephrology consult or going forth with SGLT2 She agrees to go ahead and start medication Initiate Jardiance 10 mg Check metabolic 7 in approximately 10 days Reduce glipizide to half tablet in the morning Reduce metformin to just 1/day Continue losartan Patient should get the metabolic 7 in the middle of the day when she is well-hydrated - Basic Metabolic Panel (7) - glipiZIDE (GLUCOTROL) 5 MG tablet; Take 0.5 tablets (2.5 mg total) by mouth daily before breakfast. TAKE 1/2 TABLET IN THE MORNING.  Dispense: 90 tablet;  Refill: 1  2. Hyperlipidemia associated with type 2 diabetes mellitus (HCC) Healthy diet we will follow this up again later this year after being on Jardiance if her LDL is still above 70 we would initiate Zetia  3. Essential hypertension, benign Blood pressure good control continue current measures but reduce amlodipine to 2.5 because of Jardiance  4. Type 2 diabetes mellitus with stage 3a chronic kidney disease, without long-term  current use of insulin (HCC) Patient was warned regarding the low sugars If she experiences these we will stop glipizide We will do follow-up A1c again later this year Patient will do a follow-up visit in the fall I did educate her that if she has nausea vomiting diarrhea cannot keep foods down to not take metformin or glipizide or losartan or Jardiance

## 2023-03-05 ENCOUNTER — Telehealth: Payer: Self-pay | Admitting: Family Medicine

## 2023-03-05 MED ORDER — METFORMIN HCL 500 MG PO TABS: ORAL_TABLET | ORAL | 1 refills | Status: DC

## 2023-03-05 NOTE — Telephone Encounter (Signed)
Nurses Please touch base with patient When I saw her the other day I encouraged her to get a metabolic 7 in approximately 10 days She needs to do this in the middle of the day when she is well-hydrated She does not utilize MyChart reliably Follow-up office visit in late September or October please

## 2023-03-06 NOTE — Telephone Encounter (Signed)
Patient notified of provider's recommendations and verbalized understanding.  

## 2023-03-19 ENCOUNTER — Encounter: Payer: Medicare HMO | Admitting: Family Medicine

## 2023-03-21 LAB — BASIC METABOLIC PANEL (7)
BUN/Creatinine Ratio: 8 — ABNORMAL LOW (ref 12–28)
BUN: 12 mg/dL (ref 8–27)
CO2: 24 mmol/L (ref 20–29)
Chloride: 105 mmol/L (ref 96–106)
Creatinine, Ser: 1.55 mg/dL — ABNORMAL HIGH (ref 0.57–1.00)
Glucose: 66 mg/dL — ABNORMAL LOW (ref 70–99)
Potassium: 4.5 mmol/L (ref 3.5–5.2)
Sodium: 142 mmol/L (ref 134–144)
eGFR: 35 mL/min/{1.73_m2} — ABNORMAL LOW (ref >59)

## 2023-03-22 ENCOUNTER — Other Ambulatory Visit: Payer: Self-pay | Admitting: Family Medicine

## 2023-03-22 MED ORDER — LOSARTAN POTASSIUM 50 MG PO TABS: 50.0000 mg | ORAL_TABLET | Freq: Every day | ORAL | 5 refills | Status: DC

## 2023-03-27 ENCOUNTER — Other Ambulatory Visit: Payer: Self-pay

## 2023-03-27 DIAGNOSIS — I10 Essential (primary) hypertension: Secondary | ICD-10-CM

## 2023-03-27 NOTE — Progress Notes (Signed)
Patient called and advised per Dr Lorin Picket please touch base with Tracy Spencer- her creatinine is at 1.55.  I recommend reducing the losartan from 100 mg to 50 mg.  We will send in the new prescription within epic.  I would recommend repeating the metabolic 7 again in approximately 3 weeks.  Preferably in the middle of the day when she is well-hydrated so we can get an accurate picture.  I would expect to see her numbers improved with this change but if not we may well need to get nephrology consult-also recommend a follow-up office visit with myself in September or October Patient verbalized understanding and Lab ordered in [facility]

## 2023-03-30 ENCOUNTER — Other Ambulatory Visit: Payer: Self-pay | Admitting: Family Medicine

## 2023-04-03 ENCOUNTER — Other Ambulatory Visit (HOSPITAL_COMMUNITY): Payer: Self-pay | Admitting: Family Medicine

## 2023-04-03 DIAGNOSIS — Z1231 Encounter for screening mammogram for malignant neoplasm of breast: Secondary | ICD-10-CM

## 2023-04-18 LAB — BASIC METABOLIC PANEL (7)
BUN/Creatinine Ratio: 9 — ABNORMAL LOW (ref 12–28)
BUN: 15 mg/dL (ref 8–27)
CO2: 23 mmol/L (ref 20–29)
Chloride: 101 mmol/L (ref 96–106)
Creatinine, Ser: 1.62 mg/dL — ABNORMAL HIGH (ref 0.57–1.00)
Glucose: 98 mg/dL (ref 70–99)
Potassium: 3.9 mmol/L (ref 3.5–5.2)
Sodium: 141 mmol/L (ref 134–144)
eGFR: 33 mL/min/{1.73_m2} — ABNORMAL LOW (ref >59)

## 2023-04-21 ENCOUNTER — Other Ambulatory Visit: Payer: Self-pay | Admitting: Family Medicine

## 2023-04-22 ENCOUNTER — Other Ambulatory Visit: Payer: Self-pay | Admitting: Family Medicine

## 2023-04-24 ENCOUNTER — Other Ambulatory Visit: Payer: Self-pay

## 2023-04-24 DIAGNOSIS — E1122 Type 2 diabetes mellitus with diabetic chronic kidney disease: Secondary | ICD-10-CM

## 2023-04-29 ENCOUNTER — Other Ambulatory Visit: Payer: Self-pay

## 2023-05-08 ENCOUNTER — Ambulatory Visit (HOSPITAL_COMMUNITY)
Admission: RE | Admit: 2023-05-08 | Discharge: 2023-05-08 | Disposition: A | Discharge status: Home / Self Care | Payer: Medicare HMO | Source: Ambulatory Visit | Attending: Family Medicine | Admitting: Family Medicine

## 2023-05-08 DIAGNOSIS — M810 Age-related osteoporosis without current pathological fracture: Secondary | ICD-10-CM | POA: Insufficient documentation

## 2023-05-08 DIAGNOSIS — Z1231 Encounter for screening mammogram for malignant neoplasm of breast: Secondary | ICD-10-CM | POA: Diagnosis present

## 2023-05-27 ENCOUNTER — Other Ambulatory Visit: Payer: Self-pay | Admitting: Family Medicine

## 2023-06-10 ENCOUNTER — Other Ambulatory Visit (HOSPITAL_COMMUNITY): Payer: Self-pay | Admitting: Nurse Practitioner

## 2023-06-10 DIAGNOSIS — I10 Essential (primary) hypertension: Secondary | ICD-10-CM

## 2023-06-10 DIAGNOSIS — E1122 Type 2 diabetes mellitus with diabetic chronic kidney disease: Secondary | ICD-10-CM

## 2023-06-10 DIAGNOSIS — N1831 Chronic kidney disease, stage 3a: Secondary | ICD-10-CM

## 2023-06-18 ENCOUNTER — Ambulatory Visit (HOSPITAL_COMMUNITY)
Admission: RE | Admit: 2023-06-18 | Discharge: 2023-06-18 | Disposition: A | Discharge status: Home / Self Care | Payer: Medicare HMO | Source: Ambulatory Visit | Attending: Nurse Practitioner | Admitting: Nurse Practitioner

## 2023-06-18 DIAGNOSIS — E1122 Type 2 diabetes mellitus with diabetic chronic kidney disease: Secondary | ICD-10-CM | POA: Diagnosis present

## 2023-06-18 DIAGNOSIS — I10 Essential (primary) hypertension: Secondary | ICD-10-CM | POA: Diagnosis present

## 2023-06-18 DIAGNOSIS — N1831 Chronic kidney disease, stage 3a: Secondary | ICD-10-CM | POA: Diagnosis present

## 2023-06-26 ENCOUNTER — Other Ambulatory Visit: Payer: Self-pay | Admitting: Family Medicine

## 2023-07-30 ENCOUNTER — Ambulatory Visit: Payer: Medicare HMO | Admitting: Family Medicine

## 2023-07-31 ENCOUNTER — Ambulatory Visit (INDEPENDENT_AMBULATORY_CARE_PROVIDER_SITE_OTHER): Payer: Medicare HMO | Admitting: Family Medicine

## 2023-07-31 VITALS — BP 173/77 | HR 69 | Wt 178.4 lb

## 2023-07-31 DIAGNOSIS — N183 Chronic kidney disease, stage 3 unspecified: Secondary | ICD-10-CM

## 2023-07-31 DIAGNOSIS — Z7984 Long term (current) use of oral hypoglycemic drugs: Secondary | ICD-10-CM

## 2023-07-31 DIAGNOSIS — E1122 Type 2 diabetes mellitus with diabetic chronic kidney disease: Secondary | ICD-10-CM

## 2023-07-31 DIAGNOSIS — I10 Essential (primary) hypertension: Secondary | ICD-10-CM | POA: Diagnosis not present

## 2023-07-31 DIAGNOSIS — E1169 Type 2 diabetes mellitus with other specified complication: Secondary | ICD-10-CM

## 2023-07-31 DIAGNOSIS — E785 Hyperlipidemia, unspecified: Secondary | ICD-10-CM

## 2023-07-31 MED ORDER — AMLODIPINE BESYLATE 5 MG PO TABS: 5.0000 mg | ORAL_TABLET | Freq: Every day | ORAL | 5 refills | Status: DC

## 2023-07-31 MED ORDER — KETOCONAZOLE 2 % EX CREA: 1.0000 | TOPICAL_CREAM | Freq: Two times a day (BID) | CUTANEOUS | 4 refills | Status: DC

## 2023-08-01 NOTE — Progress Notes (Signed)
Subjective:    Patient ID: Tracy Spencer, female    DOB: Sep 06, 1951, 72 y.o.   MRN: [id number]  Discussed the use of AI scribe software for clinical note transcription with the patient, who gave verbal consent to proceed.  History of Present Illness   The patient, with a history of hypertension, diabetes, and kidney disease, presents with concerns about recent changes in her medication regimen. She reports that her nephrologist recommended increasing her Amlodipine dosage from 2.5mg  to 5mg  due to elevated systolic blood pressure. However, the patient has not yet implemented this change, expressing a desire to discuss it with her primary care provider first.  The patient also reports fluctuations in her blood glucose levels, despite adherence to her prescribed regimen of Jardiance and Glipizide. She notes morning readings ranging from 120 to 150 mg/dL, which occasionally drop to around 97 mg/dL post-breakfast. She denies any symptoms of hypoglycemia when these lower readings occur.  In terms of lifestyle, the patient attempts to maintain a low-salt diet, although she acknowledges the challenges of doing so when eating out. She also tries to incorporate walking into her routine, aiming for at least 20 minutes three times a week, although recent commitments have made this difficult.  The patient recently experienced a significant personal loss, with the passing of her spouse in June. She has been managing her grief through conversation with others who have experienced similar losses and by keeping busy with various activities, including substitute teaching. She also received her flu shot on October 8th.  She was previously on a 5mg  dose of Amlodipine, which was reduced to 2.5mg . However, due to the recent blood pressure readings, there is a plan to increase the Amlodipine back to 5mg .         Review of Systems     Objective:    Physical Exam   CHEST: Lungs clear to  auscultation. CARDIOVASCULAR: Heart sounds normal. EXTREMITIES: Ankles without swelling.           Assessment & Plan:  Assessment and Plan    Hypertension Elevated systolic blood pressure. Discussed the need to increase Amlodipine from 2.5mg  to 5mg  daily to achieve better blood pressure control. -Increase Amlodipine to 5mg  daily. -Recheck blood pressure in 4 weeks.  Grief Recent loss of husband. Patient has been talking to others who have experienced similar loss and considered grief counseling. -Encourage continued open communication and consider grief counseling if timing becomes more conducive.  General Health Maintenance -Received influenza vaccine on July 21, 2023. -Continue healthy diet and regular exercise, aiming for at least 20 minutes of walking three times per week.      1. Essential hypertension, benign Blood pressure under good control - Basic Metabolic Panel (7)  2. CKD stage 3 due to type 2 diabetes mellitus (HCC) Very importantly keep A1c under good control check diabetes A1c avoid low sugars - Hemoglobin A1c  3. Hyperlipidemia associated with type 2 diabetes mellitus (HCC) Check lipid profile continue statins - Lipid panel

## 2023-08-18 ENCOUNTER — Other Ambulatory Visit: Payer: Self-pay

## 2023-08-18 ENCOUNTER — Telehealth: Payer: Self-pay

## 2023-08-18 MED ORDER — METFORMIN HCL 500 MG PO TABS: ORAL_TABLET | ORAL | 0 refills | Status: DC

## 2023-08-18 NOTE — Telephone Encounter (Signed)
Called and spoke with patient and advised per Dr Lorin Picket, you may instruct the patient that she can hold on the medication for now I am not familiar with this as a potential side effect I would like for her to give [place] an update in 1 week's time how the symptoms are doing after stopping the medication She can send [place] a MyChart message Within epic please put within the note that this medication is being held thank you  Patient verbalized understanding. Patient would like to know what does she need to take in the meantime? Metformin?

## 2023-08-18 NOTE — Telephone Encounter (Signed)
Called and left message for patient to call office.

## 2023-08-18 NOTE — Telephone Encounter (Signed)
Pt said when she started back on the JARDIANCE 10 MG TABS tablet  her mouth is dry and she described as when you drink something hot that is how she said it feels but she had not drunk nothing hot. All this started when she went back on it.    Howard Pouch [place] [phone]

## 2023-08-18 NOTE — Telephone Encounter (Signed)
Called and spoke to patient advised per Dr Lorin Picket For now we will stick with metformin 500 mg 1/day that is the maximum dose for her kidney function she needs to give [place] update next week. Patient verbalized understanding.

## 2023-08-18 NOTE — Telephone Encounter (Signed)
For now we will stick with metformin 500 mg 1/day that is the maximum dose for her kidney function she needs to give [place] update next week

## 2023-08-18 NOTE — Telephone Encounter (Signed)
Nurses-you may instruct the patient that she can hold on the medication for now I am not familiar with this as a potential side effect I would like for her to give [place] an update in 1 week's time how the symptoms are doing after stopping the medication She can send [place] a MyChart message Within epic please put within the note that this medication is being held thank you

## 2023-08-24 ENCOUNTER — Other Ambulatory Visit: Payer: Self-pay

## 2023-08-24 ENCOUNTER — Telehealth: Payer: Self-pay

## 2023-08-24 NOTE — Telephone Encounter (Signed)
Auth Submission: NO AUTH NEEDED Site of care: Site of care: [facility] Payer: [employer] Medicare Medication & CPT/J Code(s) submitted: Reclast (Zolendronic acid) [id number] Route of submission (phone, fax, portal): portal Phone # Fax # Auth type: Buy/Bill PB Units/visits requested: 1 dose Reference number:  Approval from: 08/24/23 to 09/13/23

## 2023-08-25 ENCOUNTER — Ambulatory Visit: Payer: Medicare HMO

## 2023-08-25 ENCOUNTER — Encounter (HOSPITAL_COMMUNITY): Admission: RE | Admit: 2023-08-25 | Payer: Medicare HMO | Source: Ambulatory Visit

## 2023-08-25 ENCOUNTER — Telehealth: Payer: Self-pay | Admitting: Family Medicine

## 2023-08-25 MED ORDER — POTASSIUM CHLORIDE CRYS ER 20 MEQ PO TBCR: 20.0000 meq | EXTENDED_RELEASE_TABLET | Freq: Every day | ORAL | 0 refills | Status: DC

## 2023-08-25 NOTE — Telephone Encounter (Signed)
Refill on  potassium chloride SA (KLOR-CON M) 20 MEQ tablet  [facility] scales

## 2023-08-28 ENCOUNTER — Ambulatory Visit: Payer: Medicare HMO | Admitting: Family Medicine

## 2023-09-03 LAB — LIPID PANEL
Chol/HDL Ratio: 2.7 {ratio} (ref 0.0–4.4)
Cholesterol, Total: 195 mg/dL (ref 100–199)
HDL: 71 mg/dL (ref >39)
LDL Chol Calc (NIH): 112 mg/dL — ABNORMAL HIGH (ref 0–99)
Triglycerides: 67 mg/dL (ref 0–149)
VLDL Cholesterol Cal: 12 mg/dL (ref 5–40)

## 2023-09-03 LAB — BASIC METABOLIC PANEL (7)
BUN/Creatinine Ratio: 9 — ABNORMAL LOW (ref 12–28)
BUN: 10 mg/dL (ref 8–27)
CO2: 25 mmol/L (ref 20–29)
Chloride: 103 mmol/L (ref 96–106)
Creatinine, Ser: 1.07 mg/dL — ABNORMAL HIGH (ref 0.57–1.00)
Glucose: 64 mg/dL — ABNORMAL LOW (ref 70–99)
Potassium: 4.1 mmol/L (ref 3.5–5.2)
Sodium: 143 mmol/L (ref 134–144)
eGFR: 55 mL/min/{1.73_m2} — ABNORMAL LOW (ref >59)

## 2023-09-03 LAB — HEMOGLOBIN A1C
Est. average glucose Bld gHb Est-mCnc: 151 mg/dL
Hgb A1c MFr Bld: 6.9 % — ABNORMAL HIGH (ref 4.8–5.6)

## 2023-09-07 ENCOUNTER — Encounter: Payer: Medicare HMO | Attending: Family Medicine | Admitting: Internal Medicine

## 2023-09-07 VITALS — BP 157/84 | HR 51 | Temp 97.6°F | Resp 16

## 2023-09-07 DIAGNOSIS — M810 Age-related osteoporosis without current pathological fracture: Secondary | ICD-10-CM | POA: Diagnosis not present

## 2023-09-07 MED ORDER — ZOLEDRONIC ACID 5 MG/100ML IV SOLN
5.0000 mg | Freq: Once | INTRAVENOUS | Status: AC
  Administered 2023-09-07: 5 mg via INTRAVENOUS

## 2023-09-07 MED ORDER — ACETAMINOPHEN 325 MG PO TABS
650.0000 mg | ORAL_TABLET | Freq: Once | ORAL | Status: AC
  Administered 2023-09-07: 650 mg via ORAL

## 2023-09-07 MED ORDER — DIPHENHYDRAMINE HCL 25 MG PO CAPS
25.0000 mg | ORAL_CAPSULE | Freq: Once | ORAL | Status: AC
  Administered 2023-09-07: 25 mg via ORAL

## 2023-09-07 NOTE — Progress Notes (Signed)
Diagnosis: Osteoporosis  Provider:  Lilyan Punt MD  Procedure: IV Infusion  IV Type: Peripheral, IV Location: L Antecubital  Reclast (Zolendronic Acid), Dose: 5 mg  Infusion Start Time: 1500  Infusion Stop Time: 1530  Post Infusion IV Care: Observation period completed  Discharge: Condition: Good, Destination: Home . AVS Provided  Performed by:  Cleotilde Neer, LPN

## 2023-09-15 ENCOUNTER — Ambulatory Visit: Payer: Medicare HMO

## 2023-09-16 ENCOUNTER — Ambulatory Visit: Payer: Medicare HMO | Admitting: Family Medicine

## 2023-09-16 ENCOUNTER — Encounter: Payer: Self-pay | Admitting: Family Medicine

## 2023-09-16 VITALS — BP 128/69 | HR 65 | Temp 97.5°F | Ht 64.0 in | Wt 176.0 lb

## 2023-09-16 DIAGNOSIS — N1831 Chronic kidney disease, stage 3a: Secondary | ICD-10-CM

## 2023-09-16 DIAGNOSIS — I10 Essential (primary) hypertension: Secondary | ICD-10-CM | POA: Diagnosis not present

## 2023-09-16 DIAGNOSIS — E1169 Type 2 diabetes mellitus with other specified complication: Secondary | ICD-10-CM | POA: Diagnosis not present

## 2023-09-16 DIAGNOSIS — N183 Chronic kidney disease, stage 3 unspecified: Secondary | ICD-10-CM | POA: Diagnosis not present

## 2023-09-16 DIAGNOSIS — E1122 Type 2 diabetes mellitus with diabetic chronic kidney disease: Secondary | ICD-10-CM | POA: Diagnosis not present

## 2023-09-16 DIAGNOSIS — E785 Hyperlipidemia, unspecified: Secondary | ICD-10-CM

## 2023-09-16 DIAGNOSIS — Z7984 Long term (current) use of oral hypoglycemic drugs: Secondary | ICD-10-CM

## 2023-09-16 MED ORDER — LOSARTAN POTASSIUM 50 MG PO TABS: 50.0000 mg | ORAL_TABLET | Freq: Every day | ORAL | 5 refills | Status: DC

## 2023-09-16 MED ORDER — GLIPIZIDE 5 MG PO TABS
2.5000 mg | ORAL_TABLET | Freq: Every day | ORAL | 1 refills | Status: DC
Start: 2023-09-16 — End: 2024-03-16

## 2023-09-16 MED ORDER — METFORMIN HCL 500 MG PO TABS: ORAL_TABLET | ORAL | 1 refills | Status: DC

## 2023-09-16 MED ORDER — POTASSIUM CHLORIDE CRYS ER 20 MEQ PO TBCR: 20.0000 meq | EXTENDED_RELEASE_TABLET | Freq: Every day | ORAL | 1 refills | Status: DC

## 2023-09-16 MED ORDER — ROSUVASTATIN CALCIUM 40 MG PO TABS: ORAL_TABLET | ORAL | 1 refills | Status: DC

## 2023-09-19 NOTE — Progress Notes (Signed)
   Subjective:    Patient ID: Tracy Spencer, female    DOB: 06-May-1951, 72 y.o.   MRN: [id number]  Discussed the use of AI scribe software for clinical note transcription with the patient, who gave verbal consent to proceed.  History of Present Illness   The patient, with a history of diabetes, hypertension, and hyperlipidemia, presents with a chief complaint of oral discomfort and altered taste sensation. She describes a sensation of swelling in her mouth and lips, likening it to the feeling of dental anesthesia. She also reports a sensation of her tongue feeling enlarged and rough against her teeth. These symptoms began in October and have persisted despite discontinuation of Jardiance, which the patient initially suspected as the cause.  The patient also reports difficulty swallowing her potassium pill, but no other swallowing difficulties were noted. She describes a persistent sour taste in her mouth and a sensation of food getting stuck in her throat and upper chest, requiring manual manipulation or tissue use to clear. She has been using a dry mouth wash, Biotene, multiple times a day, especially after meals, which seems to provide some relief.  The patient also mentions a recent infusion for osteoporosis, which resulted in a skin reaction at the injection site. She expresses a desire to discontinue this treatment due to the adverse reaction. Despite these health concerns, the patient remains socially active and maintains a positive outlook.         Review of Systems     Objective:    Physical Exam        General-in no acute distress Eyes-no discharge Lungs-respiratory rate normal, CTA CV-no murmurs,RRR Extremities skin warm dry no edema Neuro grossly normal Behavior normal, alert        Assessment & Plan:  Assessment and Plan    Oral Symptoms Patient reports persistent oral symptoms including a sensation of swelling, numbness, and altered taste since October. Symptoms  persisted despite discontinuation of Jardiance. Dry mouth also reported. -Continue use of Biotene mouthwash for symptomatic relief. -Consider referral to oral medicine specialist if symptoms persist.  Hyperlipidemia LDL cholesterol increased from 86 to 112 despite adherence to rosuvastatin. -Add ezetimibe to current regimen to further lower LDL cholesterol.  Chronic Kidney Disease Improved kidney function noted on recent labs (creatinine decreased from 1.62 to 1.07, GFR increased from 33 to 55). -Continue current medications including losartan, metformin, and potassium. -Consider smaller potassium pill or crushing current pill for easier swallowing.  Osteoporosis Patient reports discomfort with recent infusion treatment. -Continue current treatment plan as the infusion lasts for a year. -Discuss alternative treatments at next visit if patient remains uncomfortable with infusions.  Follow-up in late spring, with blood work to be done prior to the visit.

## 2023-10-05 LAB — HM DIABETES EYE EXAM

## 2023-12-30 ENCOUNTER — Ambulatory Visit
Admission: EM | Admit: 2023-12-30 | Discharge: 2023-12-30 | Disposition: A | Discharge status: Home / Self Care | Attending: Nurse Practitioner | Admitting: Nurse Practitioner

## 2023-12-30 DIAGNOSIS — L089 Local infection of the skin and subcutaneous tissue, unspecified: Secondary | ICD-10-CM

## 2023-12-30 MED ORDER — MUPIROCIN 2 % EX OINT: 1.0000 | TOPICAL_OINTMENT | Freq: Two times a day (BID) | CUTANEOUS | 0 refills | Status: DC

## 2023-12-30 NOTE — Discharge Instructions (Signed)
 Apply medication as prescribed. Continue warm compresses to the affected area 3-4 times daily as needed. Clean the area at least twice daily with Dial Gold bar soap or another type of antibacterial soap. Keep the area covered if it begins draining. Follow-up in this clinic if you continue to experience pain, increased swelling, or the area begins to look like a pimple. Go to the emergency department if you develop fever, chills, generalized fatigue, nausea, vomiting, or if the area of redness spreads into the genital region, or if you have foul-smelling drainage.  Follow-up as needed.

## 2023-12-30 NOTE — ED Triage Notes (Signed)
 Pt reports she has a boil on the left side of her vagina x 4 days.  States it burst open and had blood.

## 2023-12-30 NOTE — ED Provider Notes (Signed)
 [facility]    CSN: [id number] Arrival date & time: 12/30/23  1615      History   Chief Complaint No chief complaint on file.   HPI Tracy Spencer is a 73 y.o. female.   The history is provided by the patient.   Patient presents for complaints of possible abscess to the left groin has been present for the past several days.  Patient states approximately 2 to 3 days ago, the cyst "burst", states that she saw blood at that time.  Patient states she has been cleaning the area with Dial soap and using warm compresses to the area.  Patient states she wanted to get checked out because she is diabetic.  Per review of her chart, last A1c is 6.9 in November 2024.  She denies fever, chills, chest pain, abdominal pain, nausea, vomiting, diarrhea, or foul-smelling drainage from the site.  Past Medical History:  Diagnosis Date   Allergic rhinitis    Diabetes mellitus without complication (HCC)    Eczema    Hypertension    Prediabetes     Patient Active Problem List   Diagnosis Date Noted   Senile osteoporosis 08/18/2022   Vaginal discharge 11/02/2020   Hyperlipidemia associated with type 2 diabetes mellitus (HCC) 06/27/2020   Colon cancer screening    Localized osteoporosis 08/14/2014   Type 2 diabetes mellitus with stage 3a chronic kidney disease, without long-term current use of insulin (HCC) 07/18/2014   Essential hypertension, benign 03/21/2013    Past Surgical History:  Procedure Laterality Date   ABDOMINAL HYSTERECTOMY     CATARACT EXTRACTION W/PHACO Right 04/21/2016   Procedure: CATARACT EXTRACTION PHACO AND INTRAOCULAR LENS PLACEMENT (IOC);  Surgeon: Gemma Payor, MD;  Location: [facility];  Service: Ophthalmology;  Laterality: Right;  CDE:10.80   CATARACT EXTRACTION W/PHACO Left 04/07/2022   Procedure: CATARACT EXTRACTION PHACO AND INTRAOCULAR LENS PLACEMENT (IOC);  Surgeon: Fabio Pierce, MD;  Location: [facility];  Service: Ophthalmology;  Laterality: Left;  CDE: 8.92    COLONOSCOPY     COLONOSCOPY N/A 02/07/2015   Procedure: COLONOSCOPY;  Surgeon: Corbin Ade, MD;  Location: [facility];  Service: Endoscopy;  Laterality: N/A;  8:30 AM   KNEE SURGERY Left    TUBAL LIGATION      OB History     Gravida  2   Para  2   Term  2   Preterm      AB      Living  2      SAB      IAB      Ectopic      Multiple      Live Births  2            Home Medications    Prior to Admission medications   Medication Sig Start Date End Date Taking? Authorizing Provider  amLODipine (NORVASC) 5 MG tablet Take 1 tablet (5 mg total) by mouth daily. 07/31/23   Babs Sciara, MD  blood glucose meter kit and supplies Dispense based on patient and insurance preference. Use to check sugars once a day. DX:E11.9 07/25/19   Babs Sciara, MD  cetirizine (ZYRTEC) 10 MG tablet Take 1 tablet (10 mg total) by mouth daily. 07/18/14   Babs Sciara, MD  cholecalciferol (VITAMIN D3) 25 MCG (1000 UNIT) tablet Take 1,000 Units by mouth daily.    [provider]  glipiZIDE (GLUCOTROL) 5 MG tablet Take 0.5 tablets (2.5 mg total) by  mouth daily before breakfast. TAKE 1/2 TABLET IN THE MORNING. 09/16/23   Babs Sciara, MD  ketoconazole (NIZORAL) 2 % cream Apply 1 Application topically 2 (two) times daily. 07/31/23   Babs Sciara, MD  Lancets (ONETOUCH DELICA PLUS LANCET33G) MISC TEST BLOOD GLUCOSE EVERY DAY 12/11/22   Babs Sciara, MD  losartan (COZAAR) 50 MG tablet Take 1 tablet (50 mg total) by mouth daily. 09/16/23   Babs Sciara, MD  metFORMIN (GLUCOPHAGE) 500 MG tablet TAKE 1 TABLET BY MOUTH ONCE DAILY 09/16/23   Babs Sciara, MD  Omega-3 Fatty Acids (FISH OIL) 300 MG CAPS Take by mouth.    [provider]  [facility] ULTRA test strip USE AS DIRECTED TO CHECK BLOOD GLUCOSE DAILY 04/21/23   Babs Sciara, MD  potassium chloride SA (KLOR-CON M) 20 MEQ tablet Take 1 tablet (20 mEq total) by mouth daily. 09/16/23   Babs Sciara, MD   rosuvastatin (CRESTOR) 40 MG tablet TAKE 1 TABLET(40 MG) BY MOUTH DAILY 09/16/23   Babs Sciara, MD    Family History Family History  Problem Relation Age of Onset   Hypertension Mother    Hypertension Father    Heart attack Maternal Grandmother    Cancer Maternal Grandfather    Cancer Brother    Heart attack Sister    Crohn's disease Son     Social History Social History   Tobacco Use   Smoking status: Never   Smokeless tobacco: Never  Substance Use Topics   Alcohol use: Yes    Comment: occasional   Drug use: No     Allergies   Naproxen, Fosamax [alendronate sodium], and Lisinopril   Review of Systems Review of Systems Per HPI  Physical Exam Triage Vital Signs ED Triage Vitals  Encounter Vitals Group     BP 12/30/23 1652 (!) 169/80     Systolic BP Percentile --      Diastolic BP Percentile --      Pulse Rate 12/30/23 1652 73     Resp 12/30/23 1652 20     Temp 12/30/23 1652 98.3 F (36.8 C)     Temp Source 12/30/23 1652 Oral     SpO2 12/30/23 1652 94 %     Weight --      Height --      Head Circumference --      Peak Flow --      Pain Score 12/30/23 1653 0     Pain Loc --      Pain Education --      Exclude from Growth Chart --    No data found.  Updated Vital Signs BP (!) 169/80 (BP Location: Right Arm)   Pulse 73   Temp 98.3 F (36.8 C) (Oral)   Resp 20   SpO2 94%   Visual Acuity Right Eye Distance:   Left Eye Distance:   Bilateral Distance:    Right Eye Near:   Left Eye Near:    Bilateral Near:     Physical Exam Vitals and nursing note reviewed.  Constitutional:      General: She is not in acute distress.    Appearance: Normal appearance.  HENT:     Head: Normocephalic.  Eyes:     Extraocular Movements: Extraocular movements intact.     Pupils: Pupils are equal, round, and reactive to light.  Skin:    General: Skin is warm and dry.     Findings: Abscess present.  Comments: Healing abscess noted to the left groin.   Area measures approximately 1 cm x 1 cm in diameter.  The area is flat, there is no firmness, oozing, fluctuance, or drainage present.  Area is nontender to palpation.  Neurological:     General: No focal deficit present.     Mental Status: She is alert and oriented to person, place, and time.  Psychiatric:        Mood and Affect: Mood normal.        Behavior: Behavior normal.      UC Treatments / Results  Labs (all labs ordered are listed, but only abnormal results are displayed) Labs Reviewed - No data to display  EKG   Radiology No results found.  Procedures Procedures (including critical care time)  Medications Ordered in UC Medications - No data to display  Initial Impression / Assessment and Plan / UC Course  I have reviewed the triage vital signs and the nursing notes.  Pertinent labs & imaging results that were available during my care of the patient were reviewed by me and considered in my medical decision making (see chart for details).  Patient with healing abscess to the left groin.  Will treat with mupirocin 2% ointment to apply to the affected area.  Supportive care recommendations were provided and discussed with the patient to include over-the-counter Tylenol, warm compresses to the site, and monitoring for worsening symptoms.  Patient was given strict follow-up precautions.  Patient was in agreement with this plan of care and verbalizes understanding.  All questions were answered.  Patient stable for discharge.  Final Clinical Impressions(s) / UC Diagnoses   Final diagnoses:  None   Discharge Instructions   None    ED Prescriptions   None    PDMP not reviewed this encounter.   Abran Cantor, NP 12/30/23 1722

## 2024-02-14 ENCOUNTER — Other Ambulatory Visit: Payer: Self-pay | Admitting: Family Medicine

## 2024-02-15 ENCOUNTER — Other Ambulatory Visit: Payer: Self-pay

## 2024-02-15 MED ORDER — ONETOUCH DELICA PLUS LANCET33G MISC: 3 refills | Status: DC

## 2024-03-09 ENCOUNTER — Other Ambulatory Visit: Payer: Self-pay | Admitting: *Deleted

## 2024-03-09 DIAGNOSIS — N1831 Chronic kidney disease, stage 3a: Secondary | ICD-10-CM

## 2024-03-09 DIAGNOSIS — E1169 Type 2 diabetes mellitus with other specified complication: Secondary | ICD-10-CM

## 2024-03-09 DIAGNOSIS — E1122 Type 2 diabetes mellitus with diabetic chronic kidney disease: Secondary | ICD-10-CM

## 2024-03-09 DIAGNOSIS — Z79899 Other long term (current) drug therapy: Secondary | ICD-10-CM

## 2024-03-09 DIAGNOSIS — I10 Essential (primary) hypertension: Secondary | ICD-10-CM

## 2024-03-10 ENCOUNTER — Ambulatory Visit: Payer: Self-pay | Admitting: Family Medicine

## 2024-03-10 LAB — LIPID PANEL
Chol/HDL Ratio: 2.4 ratio (ref 0.0–4.4)
Cholesterol, Total: 183 mg/dL (ref 100–199)
HDL: 75 mg/dL (ref >39)
LDL Chol Calc (NIH): 91 mg/dL (ref 0–99)
Triglycerides: 98 mg/dL (ref 0–149)
VLDL Cholesterol Cal: 17 mg/dL (ref 5–40)

## 2024-03-10 LAB — BASIC METABOLIC PANEL WITH GFR
BUN/Creatinine Ratio: 11 — ABNORMAL LOW (ref 12–28)
BUN: 12 mg/dL (ref 8–27)
CO2: 22 mmol/L (ref 20–29)
Calcium: 9.9 mg/dL (ref 8.7–10.3)
Chloride: 101 mmol/L (ref 96–106)
Creatinine, Ser: 1.14 mg/dL — ABNORMAL HIGH (ref 0.57–1.00)
Glucose: 86 mg/dL (ref 70–99)
Potassium: 3.7 mmol/L (ref 3.5–5.2)
Sodium: 138 mmol/L (ref 134–144)
eGFR: 51 mL/min/{1.73_m2} — ABNORMAL LOW (ref >59)

## 2024-03-10 LAB — MICROALBUMIN / CREATININE URINE RATIO
Creatinine, Urine: 32.4 mg/dL
Microalb/Creat Ratio: 210 mg/g{creat} — ABNORMAL HIGH (ref 0–29)
Microalbumin, Urine: 68.1 ug/mL

## 2024-03-10 LAB — HEPATIC FUNCTION PANEL
ALT: 18 IU/L (ref 0–32)
AST: 24 IU/L (ref 0–40)
Albumin: 4.8 g/dL (ref 3.8–4.8)
Alkaline Phosphatase: 55 IU/L (ref 44–121)
Bilirubin Total: 0.3 mg/dL (ref 0.0–1.2)
Bilirubin, Direct: 0.13 mg/dL (ref 0.00–0.40)
Total Protein: 7.6 g/dL (ref 6.0–8.5)

## 2024-03-10 LAB — HEMOGLOBIN A1C
Est. average glucose Bld gHb Est-mCnc: 148 mg/dL
Hgb A1c MFr Bld: 6.8 % — ABNORMAL HIGH (ref 4.8–5.6)

## 2024-03-16 ENCOUNTER — Ambulatory Visit: Payer: Medicare HMO | Admitting: Family Medicine

## 2024-03-16 VITALS — BP 131/76 | Ht 64.0 in | Wt 182.8 lb

## 2024-03-16 DIAGNOSIS — N183 Chronic kidney disease, stage 3 unspecified: Secondary | ICD-10-CM | POA: Diagnosis not present

## 2024-03-16 DIAGNOSIS — E785 Hyperlipidemia, unspecified: Secondary | ICD-10-CM

## 2024-03-16 DIAGNOSIS — E1169 Type 2 diabetes mellitus with other specified complication: Secondary | ICD-10-CM

## 2024-03-16 DIAGNOSIS — I10 Essential (primary) hypertension: Secondary | ICD-10-CM | POA: Diagnosis not present

## 2024-03-16 DIAGNOSIS — Z79899 Other long term (current) drug therapy: Secondary | ICD-10-CM | POA: Diagnosis not present

## 2024-03-16 DIAGNOSIS — N1831 Chronic kidney disease, stage 3a: Secondary | ICD-10-CM

## 2024-03-16 DIAGNOSIS — E1122 Type 2 diabetes mellitus with diabetic chronic kidney disease: Secondary | ICD-10-CM

## 2024-03-16 MED ORDER — METFORMIN HCL 500 MG PO TABS: ORAL_TABLET | ORAL | 1 refills | Status: DC

## 2024-03-16 MED ORDER — ROSUVASTATIN CALCIUM 40 MG PO TABS: ORAL_TABLET | ORAL | 1 refills | Status: DC

## 2024-03-16 MED ORDER — GLIPIZIDE 5 MG PO TABS: 2.5000 mg | ORAL_TABLET | Freq: Every day | ORAL | 1 refills | Status: DC

## 2024-03-16 MED ORDER — POTASSIUM CHLORIDE CRYS ER 20 MEQ PO TBCR: 20.0000 meq | EXTENDED_RELEASE_TABLET | Freq: Every day | ORAL | 1 refills | Status: DC

## 2024-03-16 MED ORDER — AMLODIPINE BESYLATE 10 MG PO TABS: 10.0000 mg | ORAL_TABLET | Freq: Every day | ORAL | 5 refills | Status: DC

## 2024-03-16 MED ORDER — LOSARTAN POTASSIUM 50 MG PO TABS: 50.0000 mg | ORAL_TABLET | Freq: Every day | ORAL | 1 refills | Status: DC

## 2024-03-16 NOTE — Progress Notes (Signed)
 Subjective:    Patient ID: Tracy Spencer, female    DOB: 29-Apr-1951, 72 y.o.   MRN: [id number]  HPI  Patient arrives for a follow up on hypertension. Patient states Zyrtec  not working good for her anymore Patient for blood pressure check up.  The patient does have hypertension.   Patient relates dietary measures try to minimize salt The importance of healthy diet and activity were discussed Patient relates compliance  Patient here for follow-up regarding cholesterol.    Patient relates taking medication on a regular basis Denies problems with medication Importance of dietary measures discussed Regular lab work regarding lipid and liver was checked and if needing additional labs was appropriately ordered  The patient was seen today as part of a comprehensive diabetic check up. Patient has diabetes Patient relates good compliance with taking the medication. We discussed their diet and exercise activities  We also discussed the importance of notifying us  if any excessively high glucoses or low sugars.    Review of Systems     Objective:   Physical Exam  General-in no acute distress Eyes-no discharge Lungs-respiratory rate normal, CTA CV-no murmurs,RRR Extremities skin warm dry no edema Neuro grossly normal Behavior normal, alert  Results for orders placed or performed in visit on 03/09/24  Lipid panel   Collection Time: 03/09/24  4:22 PM  Result Value Ref Range   Cholesterol, Total 183 100 - 199 mg/dL   Triglycerides 98 0 - 149 mg/dL   HDL 75 >40 mg/dL   VLDL Cholesterol Cal 17 5 - 40 mg/dL   LDL Chol Calc (NIH) 91 0 - 99 mg/dL   Chol/HDL Ratio 2.4 0.0 - 4.4 ratio  Hepatic function panel   Collection Time: 03/09/24  4:22 PM  Result Value Ref Range   Total Protein 7.6 6.0 - 8.5 g/dL   Albumin 4.8 3.8 - 4.8 g/dL   Bilirubin Total 0.3 0.0 - 1.2 mg/dL   Bilirubin, Direct 9.81 0.00 - 0.40 mg/dL   Alkaline Phosphatase 55 44 - 121 IU/L   AST 24 0 - 40 IU/L   ALT  18 0 - 32 IU/L  Microalbumin / creatinine urine ratio   Collection Time: 03/09/24  4:22 PM  Result Value Ref Range   Creatinine, Urine 32.4 Not Estab. mg/dL   Microalbumin, Urine 19.1 Not Estab. ug/mL   Microalb/Creat Ratio 210 (H) 0 - 29 mg/g creat  Basic metabolic panel with GFR   Collection Time: 03/09/24  4:22 PM  Result Value Ref Range   Glucose 86 70 - 99 mg/dL   BUN 12 8 - 27 mg/dL   Creatinine, Ser 4.78 (H) 0.57 - 1.00 mg/dL   eGFR 51 (L) >29 FA/OZH/0.86   BUN/Creatinine Ratio 11 (L) 12 - 28   Sodium 138 134 - 144 mmol/L   Potassium 3.7 3.5 - 5.2 mmol/L   Chloride 101 96 - 106 mmol/L   CO2 22 20 - 29 mmol/L   Calcium  9.9 8.7 - 10.3 mg/dL  Hemoglobin V7Q   Collection Time: 03/09/24  4:22 PM  Result Value Ref Range   Hgb A1c MFr Bld 6.8 (H) 4.8 - 5.6 %   Est. average glucose Bld gHb Est-mCnc 148 mg/dL    Labs reviewed in detail Retaining urine noted creatinine noted patient does see specialist every 6 months     Assessment & Plan:   1. CKD stage 3 due to type 2 diabetes mellitus (HCC) (Primary) Continue current medication.  In addition to this  bump up dose of amlodipine  to 10 mg patient warned about the possibility of swelling in the ankles notify us  if any problems follow-up as planned - glipiZIDE  (GLUCOTROL ) 5 MG tablet; Take 0.5 tablets (2.5 mg total) by mouth daily before breakfast. TAKE 1/2 TABLET IN THE MORNING.  Dispense: 90 tablet; Refill: 1  2. Hyperlipidemia associated with type 2 diabetes mellitus (HCC) Keep LDL below 70 if possible, continue current medication, consider additional medicine on next lab work  3. Type 2 diabetes mellitus with stage 3a chronic kidney disease, without long-term current use of insulin (HCC) A1c good control continue current measures  4. Essential hypertension, benign Blood pressure not at goal bump up dose of amlodipine  10 mg daily side effect of pedal edema noted and discussed  5. High risk medication use Labs look  good Blood pressure recheck in several weeks

## 2024-03-26 ENCOUNTER — Other Ambulatory Visit: Payer: Self-pay | Admitting: Family Medicine

## 2024-04-07 ENCOUNTER — Other Ambulatory Visit (HOSPITAL_COMMUNITY): Payer: Self-pay | Admitting: Family Medicine

## 2024-04-07 DIAGNOSIS — Z1231 Encounter for screening mammogram for malignant neoplasm of breast: Secondary | ICD-10-CM

## 2024-04-13 ENCOUNTER — Ambulatory Visit: Admitting: Family Medicine

## 2024-04-13 ENCOUNTER — Encounter: Payer: Self-pay | Admitting: Family Medicine

## 2024-04-13 VITALS — BP 114/62 | HR 55 | Temp 97.7°F | Ht 64.0 in | Wt 185.0 lb

## 2024-04-13 DIAGNOSIS — N1831 Chronic kidney disease, stage 3a: Secondary | ICD-10-CM

## 2024-04-13 DIAGNOSIS — I10 Essential (primary) hypertension: Secondary | ICD-10-CM

## 2024-04-13 DIAGNOSIS — E1169 Type 2 diabetes mellitus with other specified complication: Secondary | ICD-10-CM

## 2024-04-13 DIAGNOSIS — E1122 Type 2 diabetes mellitus with diabetic chronic kidney disease: Secondary | ICD-10-CM | POA: Diagnosis not present

## 2024-04-13 DIAGNOSIS — N183 Chronic kidney disease, stage 3 unspecified: Secondary | ICD-10-CM

## 2024-04-13 DIAGNOSIS — E785 Hyperlipidemia, unspecified: Secondary | ICD-10-CM

## 2024-04-13 NOTE — Progress Notes (Signed)
   Subjective:    Patient ID: Tracy Spencer, female    DOB: [birth year], 73 y.o.   MRN: [id number]  HPI Hypertension follow up  Taking her medicines Denies any chest tightness pressure pain shortness of breath Trying to eat healthy Trying to stay active   Review of Systems     Objective:   Physical Exam General-in no acute distress Eyes-no discharge Lungs-respiratory rate normal, CTA CV-no murmurs,RRR Extremities skin warm dry no edema Neuro grossly normal Behavior normal, alert  Blood pressure recheck very good       Assessment & Plan:  HTN Good control Continue current measures Lab work will be ordered for later this year with follow-up office visit in approximately 6 months

## 2024-05-09 ENCOUNTER — Ambulatory Visit (HOSPITAL_COMMUNITY)
Admission: RE | Admit: 2024-05-09 | Discharge: 2024-05-09 | Disposition: A | Discharge status: Home / Self Care | Source: Ambulatory Visit | Attending: Family Medicine | Admitting: Family Medicine

## 2024-05-09 ENCOUNTER — Encounter (HOSPITAL_COMMUNITY): Payer: Self-pay

## 2024-05-09 DIAGNOSIS — Z1231 Encounter for screening mammogram for malignant neoplasm of breast: Secondary | ICD-10-CM | POA: Diagnosis present

## 2024-07-20 ENCOUNTER — Encounter: Payer: Self-pay | Admitting: Family Medicine

## 2024-07-22 ENCOUNTER — Telehealth: Payer: Self-pay

## 2024-07-22 ENCOUNTER — Other Ambulatory Visit (HOSPITAL_COMMUNITY): Payer: Self-pay | Admitting: Family Medicine

## 2024-07-22 NOTE — Telephone Encounter (Signed)
 Auth Submission: NO AUTH NEEDED Site of care: Site of care: [facility] Payer: [employer] medicare Medication & CPT/J Code(s) submitted: Reclast  (Zolendronic acid) [id number] Diagnosis Code:  Route of submission (phone, fax, portal): portal Phone # Fax # Auth type: Buy/Bill PB Units/visits requested: 5mg  x 1 dose Reference number:  Approval from: 07/22/24 to 10/12/24

## 2024-09-06 ENCOUNTER — Ambulatory Visit: Payer: Medicare HMO

## 2024-09-06 ENCOUNTER — Encounter: Attending: Family Medicine | Admitting: Internal Medicine

## 2024-09-06 VITALS — BP 136/70 | HR 53 | Temp 97.7°F | Resp 16

## 2024-09-06 DIAGNOSIS — M810 Age-related osteoporosis without current pathological fracture: Secondary | ICD-10-CM

## 2024-09-06 MED ORDER — ZOLEDRONIC ACID 5 MG/100ML IV SOLN
5.0000 mg | Freq: Once | INTRAVENOUS | Status: AC
  Administered 2024-09-06: 5 mg via INTRAVENOUS

## 2024-09-06 MED ORDER — DIPHENHYDRAMINE HCL 25 MG PO CAPS: 25.0000 mg | ORAL_CAPSULE | Freq: Once | ORAL | Status: DC

## 2024-09-06 MED ORDER — ACETAMINOPHEN 325 MG PO TABS: 650.0000 mg | ORAL_TABLET | Freq: Once | ORAL | Status: DC

## 2024-09-06 NOTE — Progress Notes (Signed)
 Diagnosis: Osteoporosis  Provider:  Alphonsa Hamilton MD  Procedure: IV Infusion  IV Type: Peripheral, IV Location: L Antecubital  Medication, Reclast  (Zolendronic Acid), Dose: 5 mg  Infusion Start Time: 1401  Infusion Stop Time: 1438  Post Infusion IV Care: Observation period completed  Discharge: Condition: Good, Destination: Home . AVS Provided  Performed by:  Blanca Selinda SAUNDERS, LPN

## 2024-09-21 ENCOUNTER — Encounter: Payer: Self-pay | Admitting: *Deleted

## 2024-09-21 ENCOUNTER — Other Ambulatory Visit: Payer: Self-pay | Admitting: Family Medicine

## 2024-09-21 ENCOUNTER — Telehealth: Payer: Self-pay | Admitting: *Deleted

## 2024-09-21 DIAGNOSIS — I10 Essential (primary) hypertension: Secondary | ICD-10-CM

## 2024-09-21 DIAGNOSIS — E1169 Type 2 diabetes mellitus with other specified complication: Secondary | ICD-10-CM

## 2024-09-21 DIAGNOSIS — Z79899 Other long term (current) drug therapy: Secondary | ICD-10-CM

## 2024-09-21 DIAGNOSIS — E1122 Type 2 diabetes mellitus with diabetic chronic kidney disease: Secondary | ICD-10-CM

## 2024-09-21 DIAGNOSIS — N1831 Chronic kidney disease, stage 3a: Secondary | ICD-10-CM

## 2024-09-21 NOTE — Telephone Encounter (Signed)
 Patient notified via mychart

## 2024-09-21 NOTE — Telephone Encounter (Signed)
 Labs were ordered thank you

## 2024-09-21 NOTE — Telephone Encounter (Signed)
 Copied from CRM #[id number]. Topic: Clinical - Request for Lab/Test Order >> Sep 21, 2024 10:58 AM Harlene ORN wrote: Reason for CRM: Patient called requesting labs orders for her to check her Diabetes. Would like to have labs done before her appointment.

## 2024-09-22 ENCOUNTER — Other Ambulatory Visit: Payer: Self-pay | Admitting: Family Medicine

## 2024-09-26 ENCOUNTER — Telehealth: Payer: Self-pay

## 2024-09-26 MED ORDER — ROSUVASTATIN CALCIUM 40 MG PO TABS: ORAL_TABLET | ORAL | 0 refills | Status: DC

## 2024-09-26 NOTE — Telephone Encounter (Signed)
Received via fax Rx request: Prescription sent electronically to pharmacy  

## 2024-09-26 NOTE — Telephone Encounter (Signed)
 Prescription Request  09/26/2024  LOV: Visit date not found  What is the name of the medication or equipment? rosuvastatin  (CRESTOR ) 40 MG tablet   Have you contacted your pharmacy to request a refill? Yes   Which pharmacy would you like this sent to?  [id number] - [place], [place] - [address] [place] [address] Phone: [phone] Fax: [phone]    Patient notified that their request is being sent to the clinical staff for review and that they should receive a response within 2 business days.   Please advise at Mobile [phone] (mobile)

## 2024-10-04 LAB — HEPATIC FUNCTION PANEL
ALT: 15 IU/L (ref 0–32)
AST: 22 IU/L (ref 0–40)
Albumin: 4.7 g/dL (ref 3.8–4.8)
Alkaline Phosphatase: 71 IU/L (ref 49–135)
Bilirubin Total: 0.4 mg/dL (ref 0.0–1.2)
Bilirubin, Direct: 0.15 mg/dL (ref 0.00–0.40)
Total Protein: 7.4 g/dL (ref 6.0–8.5)

## 2024-10-04 LAB — MICROALBUMIN / CREATININE URINE RATIO
Creatinine, Urine: 93.2 mg/dL
Microalb/Creat Ratio: 246 mg/g{creat} — ABNORMAL HIGH (ref 0–29)
Microalbumin, Urine: 229.6 ug/mL

## 2024-10-04 LAB — BASIC METABOLIC PANEL WITH GFR
BUN/Creatinine Ratio: 8 — ABNORMAL LOW (ref 12–28)
BUN: 11 mg/dL (ref 8–27)
CO2: 19 mmol/L — ABNORMAL LOW (ref 20–29)
Calcium: 10 mg/dL (ref 8.7–10.3)
Chloride: 103 mmol/L (ref 96–106)
Creatinine, Ser: 1.46 mg/dL — ABNORMAL HIGH (ref 0.57–1.00)
Glucose: 131 mg/dL — ABNORMAL HIGH (ref 70–99)
Potassium: 3.6 mmol/L (ref 3.5–5.2)
Sodium: 139 mmol/L (ref 134–144)
eGFR: 37 mL/min/1.73 — ABNORMAL LOW

## 2024-10-04 LAB — HEMOGLOBIN A1C
Est. average glucose Bld gHb Est-mCnc: 163 mg/dL
Hgb A1c MFr Bld: 7.3 % — ABNORMAL HIGH (ref 4.8–5.6)

## 2024-10-04 LAB — LIPID PANEL
Chol/HDL Ratio: 2.3 ratio (ref 0.0–4.4)
Cholesterol, Total: 169 mg/dL (ref 100–199)
HDL: 73 mg/dL
LDL Chol Calc (NIH): 83 mg/dL (ref 0–99)
Triglycerides: 68 mg/dL (ref 0–149)
VLDL Cholesterol Cal: 13 mg/dL (ref 5–40)

## 2024-10-05 ENCOUNTER — Ambulatory Visit: Payer: Self-pay | Admitting: Family Medicine

## 2024-10-11 ENCOUNTER — Encounter: Payer: Self-pay | Admitting: Family Medicine

## 2024-10-11 ENCOUNTER — Ambulatory Visit (INDEPENDENT_AMBULATORY_CARE_PROVIDER_SITE_OTHER): Admitting: Family Medicine

## 2024-10-11 VITALS — Ht 64.0 in | Wt 187.0 lb

## 2024-10-11 DIAGNOSIS — E1122 Type 2 diabetes mellitus with diabetic chronic kidney disease: Secondary | ICD-10-CM

## 2024-10-11 DIAGNOSIS — N1831 Chronic kidney disease, stage 3a: Secondary | ICD-10-CM

## 2024-10-11 DIAGNOSIS — Z7984 Long term (current) use of oral hypoglycemic drugs: Secondary | ICD-10-CM | POA: Diagnosis not present

## 2024-10-11 DIAGNOSIS — M5432 Sciatica, left side: Secondary | ICD-10-CM | POA: Diagnosis not present

## 2024-10-11 DIAGNOSIS — I10 Essential (primary) hypertension: Secondary | ICD-10-CM | POA: Diagnosis not present

## 2024-10-11 DIAGNOSIS — E785 Hyperlipidemia, unspecified: Secondary | ICD-10-CM | POA: Diagnosis not present

## 2024-10-11 DIAGNOSIS — E1169 Type 2 diabetes mellitus with other specified complication: Secondary | ICD-10-CM

## 2024-10-11 DIAGNOSIS — N183 Chronic kidney disease, stage 3 unspecified: Secondary | ICD-10-CM

## 2024-10-11 MED ORDER — POTASSIUM CHLORIDE CRYS ER 20 MEQ PO TBCR: 20.0000 meq | EXTENDED_RELEASE_TABLET | Freq: Every day | ORAL | 1 refills | Status: AC

## 2024-10-11 MED ORDER — AMLODIPINE BESYLATE 10 MG PO TABS: 10.0000 mg | ORAL_TABLET | Freq: Every day | ORAL | 5 refills | Status: AC

## 2024-10-11 MED ORDER — GLUCOSE BLOOD VI STRP: 1.0000 | ORAL_STRIP | Freq: Every day | 9 refills | Status: DC

## 2024-10-11 MED ORDER — ROSUVASTATIN CALCIUM 40 MG PO TABS: ORAL_TABLET | ORAL | 1 refills | Status: DC

## 2024-10-11 MED ORDER — METFORMIN HCL 500 MG PO TABS: ORAL_TABLET | ORAL | 1 refills | Status: AC

## 2024-10-11 MED ORDER — BLOOD GLUCOSE METER KIT: PACK | 0 refills | Status: DC

## 2024-10-11 MED ORDER — GLIPIZIDE 5 MG PO TABS: 2.5000 mg | ORAL_TABLET | Freq: Every day | ORAL | 1 refills | Status: AC

## 2024-10-11 MED ORDER — LOSARTAN POTASSIUM 50 MG PO TABS: 50.0000 mg | ORAL_TABLET | Freq: Every day | ORAL | 1 refills | Status: AC

## 2024-10-11 NOTE — Progress Notes (Signed)
 "  Subjective:    Patient ID: Tracy Spencer, female    DOB: 1951-03-10, 73 y.o.   MRN: [id number]  HPI  DM, HTN, CKD, hypelipidemia Discussed the use of AI scribe software for clinical note transcription with the patient, who gave verbal consent to proceed.  History of Present Illness   Tracy Spencer is a 73 year old female with diabetes and hypertension who presents with leg pain.  She experiences an achy sensation in the lateral part of her leg, extending from the calf to the mid-thigh. The pain is intermittent, occurring both during the day and at night, sometimes waking her from sleep. It can start even when sitting at her desk and does not improve with Tylenol , which she takes reluctantly. No specific triggers or alleviating factors have been identified, and the pain has become more frequent over the past few months.  She denies significant back pain unless bending improperly and does not report any numbness or tingling associated with the leg pain, although she finds it difficult to discern. She has a history of knee issues that required intervention, but she does not believe this is related to her current leg pain.  She is currently taking metformin  and glipizide  for diabetes, losartan  for hypertension, and a cholesterol medication. She mentions taking Tylenol  occasionally for pain but prefers not to use it. She also takes a medication to aid bowel movements, though she cannot recall the name. Her recent lab work shows an increase in A1c from 6.8 to 7.3. She also has a history of kidney issues, with recent labs showing increased protein in the urine. She attempts to maintain a healthy diet.      Patient for blood pressure check up.  The patient does have hypertension.   Patient relates dietary measures try to minimize salt The importance of healthy diet and activity were discussed Patient relates compliance  Patient here for follow-up regarding cholesterol.    Patient relates  taking medication on a regular basis Denies problems with medication Importance of dietary measures discussed Regular lab work regarding lipid and liver was checked and if needing additional labs was appropriately ordered  The patient was seen today as part of a comprehensive diabetic check up. Patient has diabetes Patient relates good compliance with taking the medication. We discussed their diet and exercise activities  We also discussed the importance of notifying us  if any excessively high glucoses or low sugars.  Results for orders placed or performed in visit on 09/21/24  Lipid Panel   Collection Time: 10/03/24  8:39 AM  Result Value Ref Range   Cholesterol, Total 169 100 - 199 mg/dL   Triglycerides 68 0 - 149 mg/dL   HDL 73 >60 mg/dL   VLDL Cholesterol Cal 13 5 - 40 mg/dL   LDL Chol Calc (NIH) 83 0 - 99 mg/dL   Chol/HDL Ratio 2.3 0.0 - 4.4 ratio  Hepatic function panel   Collection Time: 10/03/24  8:39 AM  Result Value Ref Range   Total Protein 7.4 6.0 - 8.5 g/dL   Albumin 4.7 3.8 - 4.8 g/dL   Bilirubin Total 0.4 0.0 - 1.2 mg/dL   Bilirubin, Direct 9.84 0.00 - 0.40 mg/dL   Alkaline Phosphatase 71 49 - 135 IU/L   AST 22 0 - 40 IU/L   ALT 15 0 - 32 IU/L  Hemoglobin A1c   Collection Time: 10/03/24  8:39 AM  Result Value Ref Range   Hgb A1c MFr Bld 7.3 (H)  4.8 - 5.6 %   Est. average glucose Bld gHb Est-mCnc 163 mg/dL  Basic metabolic panel with GFR   Collection Time: 10/03/24  8:39 AM  Result Value Ref Range   Glucose 131 (H) 70 - 99 mg/dL   BUN 11 8 - 27 mg/dL   Creatinine, Ser 8.53 (H) 0.57 - 1.00 mg/dL   eGFR 37 (L) >40 fO/fpw/8.26   BUN/Creatinine Ratio 8 (L) 12 - 28   Sodium 139 134 - 144 mmol/L   Potassium 3.6 3.5 - 5.2 mmol/L   Chloride 103 96 - 106 mmol/L   CO2 19 (L) 20 - 29 mmol/L   Calcium  10.0 8.7 - 10.3 mg/dL  Microalbumin/Creatinine Ratio, Urine   Collection Time: 10/03/24  8:39 AM  Result Value Ref Range   Creatinine, Urine 93.2 Not Estab. mg/dL    Microalbumin, Urine 229.6 Not Estab. ug/mL   Microalb/Creat Ratio 246 (H) 0 - 29 mg/g creat     Review of Systems     Objective:   Physical Exam  General-in no acute distress Eyes-no discharge Lungs-respiratory rate normal, CTA CV-no murmurs,RRR Extremities skin warm dry no edema Neuro grossly normal Behavior normal, alert       Assessment & Plan:  1. Type 2 diabetes mellitus with stage 3a chronic kidney disease, without long-term current use of insulin (HCC) (Primary) A1c reasonable control but with her GFR going down would recommend keeping metformin  where it is May need to adjust glipizide  - glucose blood test strip; 1 each by Other route daily before breakfast. Use as instructed  Dispense: 100 each; Refill: 9  2. CKD stage 3 due to type 2 diabetes mellitus (HCC) I am concerned about the increased protein as well as the increase creatinine and lower GFR we will touch base with nephrology may need to adjust your medicines to get better blood pressure control - glipiZIDE  (GLUCOTROL ) 5 MG tablet; Take 0.5 tablets (2.5 mg total) by mouth daily before breakfast. TAKE 1/2 TABLET IN THE MORNING.  Dispense: 90 tablet; Refill: 1  3. Sciatica, left side Patient prefers to do home care she will let us  know if she needs any type of Lyrica or medicines like that but for now she would prefer not to  4. Hyperlipidemia associated with type 2 diabetes mellitus (HCC) Try to keep LDL below 70 healthy diet continue current meds  5. Essential hypertension, benign Blood pressure decent control  Follow-up 6 months "

## 2024-10-25 ENCOUNTER — Other Ambulatory Visit: Payer: Self-pay | Admitting: Family Medicine

## 2024-10-27 ENCOUNTER — Other Ambulatory Visit: Payer: Self-pay

## 2024-10-27 ENCOUNTER — Telehealth: Payer: Self-pay | Admitting: Family Medicine

## 2024-10-27 DIAGNOSIS — E1122 Type 2 diabetes mellitus with diabetic chronic kidney disease: Secondary | ICD-10-CM

## 2024-10-27 MED ORDER — GLUCOSE BLOOD VI STRP: 1.0000 | ORAL_STRIP | Freq: Every day | 9 refills | Status: AC

## 2024-10-27 MED ORDER — BLOOD GLUCOSE METER KIT: 1.0000 | PACK | Freq: Every day | 0 refills | Status: AC

## 2024-10-27 MED ORDER — BLOOD GLUCOSE METER KIT: 1.0000 | PACK | Freq: Every day | 0 refills | Status: DC

## 2024-10-27 MED ORDER — ONETOUCH DELICA PLUS LANCET33G MISC: 1.0000 | Freq: Every day | 3 refills | Status: AC

## 2024-10-27 NOTE — Telephone Encounter (Signed)
 Copied from CRM #[id number]. Topic: Clinical - Prescription Issue >> Oct 27, 2024 12:37 PM Nessti S wrote: Reason for CRM: [facility] called because pt dropped off prescription but she needs quantity for glucose blood test strip and how often pt needs to test for blood glucose meter kit and supplies. She would like a call back soon as possible.

## 2024-10-27 NOTE — Telephone Encounter (Signed)
 Spoke with patient and informed pharmacy , prescriptions have been sent to pharmacy as indicated

## 2024-11-01 ENCOUNTER — Telehealth: Payer: Self-pay | Admitting: *Deleted

## 2024-11-01 NOTE — Telephone Encounter (Signed)
 LMOVM to return call  Pt left vm stating she received a message via mychart about scheduling procedure.

## 2024-11-02 NOTE — Telephone Encounter (Signed)
 Pt informed that was a recall for her colonoscopy to be done and she will get a questionnaire mailed to her when it is closer the time. Pt verbalized understanding.

## 2025-04-11 ENCOUNTER — Ambulatory Visit: Admitting: Family Medicine

## 2025-09-06 ENCOUNTER — Ambulatory Visit
